# Patient Record
Sex: Male | Born: 1980 | Race: White | Hispanic: No | Marital: Married | Smoking: Never smoker
Health system: Southern US, Community
[De-identification: ages and names within clinical notes are randomized; demographics above are authoritative.]

## PROBLEM LIST (undated history)

## (undated) DIAGNOSIS — J9601 Acute respiratory failure with hypoxia: Secondary | ICD-10-CM

## (undated) DIAGNOSIS — U071 Acute respiratory failure with hypoxia: Secondary | ICD-10-CM

## (undated) DIAGNOSIS — F419 Anxiety disorder, unspecified: Secondary | ICD-10-CM

## (undated) DIAGNOSIS — S1191XA Laceration without foreign body of unspecified part of neck, initial encounter: Secondary | ICD-10-CM

## (undated) DIAGNOSIS — F333 Major depressive disorder, recurrent, severe with psychotic symptoms: Secondary | ICD-10-CM
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
## 2020-04-24 ENCOUNTER — Emergency Department (HOSPITAL_COMMUNITY)
Admission: EM | Admit: 2020-04-24 | Discharge: 2020-04-25 | Disposition: A | Discharge status: Home / Self Care | Payer: BC Managed Care – PPO | Source: Home / Self Care | Attending: Emergency Medicine | Admitting: Emergency Medicine

## 2020-04-24 ENCOUNTER — Other Ambulatory Visit: Payer: Self-pay

## 2020-04-24 ENCOUNTER — Encounter (HOSPITAL_COMMUNITY): Payer: Self-pay | Admitting: Emergency Medicine

## 2020-04-24 DIAGNOSIS — F323 Major depressive disorder, single episode, severe with psychotic features: Secondary | ICD-10-CM

## 2020-04-24 DIAGNOSIS — Z20822 Contact with and (suspected) exposure to covid-19: Secondary | ICD-10-CM | POA: Insufficient documentation
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
## 2020-04-25 ENCOUNTER — Inpatient Hospital Stay (HOSPITAL_COMMUNITY)
Admission: RE | Admit: 2020-04-25 | Discharge: 2020-04-27 | DRG: 885 | Disposition: A | Discharge status: Home / Self Care | Payer: BC Managed Care – PPO | Source: Intra-hospital | Attending: Psychiatry | Admitting: Psychiatry

## 2020-04-25 ENCOUNTER — Encounter (HOSPITAL_COMMUNITY): Payer: Self-pay | Admitting: Psychiatry

## 2020-04-25 DIAGNOSIS — Z818 Family history of other mental and behavioral disorders: Secondary | ICD-10-CM | POA: Diagnosis not present

## 2020-04-25 DIAGNOSIS — Z630 Problems in relationship with spouse or partner: Secondary | ICD-10-CM | POA: Diagnosis not present

## 2020-04-25 DIAGNOSIS — I10 Essential (primary) hypertension: Secondary | ICD-10-CM | POA: Diagnosis present

## 2020-04-25 DIAGNOSIS — F411 Generalized anxiety disorder: Secondary | ICD-10-CM | POA: Diagnosis present

## 2020-04-25 DIAGNOSIS — G4700 Insomnia, unspecified: Secondary | ICD-10-CM | POA: Diagnosis present

## 2020-04-25 DIAGNOSIS — R45851 Suicidal ideations: Secondary | ICD-10-CM | POA: Diagnosis present

## 2020-04-25 DIAGNOSIS — R000 Tachycardia, unspecified: Secondary | ICD-10-CM | POA: Diagnosis present

## 2020-04-25 DIAGNOSIS — F323 Major depressive disorder, single episode, severe with psychotic features: Principal | ICD-10-CM | POA: Diagnosis present

## 2020-04-25 DIAGNOSIS — E1165 Type 2 diabetes mellitus with hyperglycemia: Secondary | ICD-10-CM | POA: Diagnosis present

## 2020-04-25 DIAGNOSIS — Z20822 Contact with and (suspected) exposure to covid-19: Secondary | ICD-10-CM | POA: Diagnosis present

## 2020-04-25 LAB — RAPID URINE DRUG SCREEN, HOSP PERFORMED
Amphetamines: NOT DETECTED
Barbiturates: NOT DETECTED
Benzodiazepines: NOT DETECTED
Cocaine: NOT DETECTED
Opiates: NOT DETECTED
Tetrahydrocannabinol: NOT DETECTED

## 2020-04-25 LAB — CBC WITH DIFFERENTIAL/PLATELET
Abs Immature Granulocytes: 0.02 10*3/uL (ref 0.00–0.07)
Basophils Absolute: 0.1 10*3/uL (ref 0.0–0.1)
Basophils Relative: 1 %
Eosinophils Absolute: 0.2 10*3/uL (ref 0.0–0.5)
Eosinophils Relative: 2 %
HCT: 48.1 % (ref 39.0–52.0)
Hemoglobin: 16.4 g/dL (ref 13.0–17.0)
Immature Granulocytes: 0 %
Lymphocytes Relative: 26 %
Lymphs Abs: 2.3 10*3/uL (ref 0.7–4.0)
MCH: 30.6 pg (ref 26.0–34.0)
MCHC: 34.1 g/dL (ref 30.0–36.0)
MCV: 89.7 fL (ref 80.0–100.0)
Monocytes Absolute: 0.9 10*3/uL (ref 0.1–1.0)
Monocytes Relative: 10 %
Neutro Abs: 5.3 10*3/uL (ref 1.7–7.7)
Neutrophils Relative %: 61 %
Platelets: 308 10*3/uL (ref 150–400)
RBC: 5.36 MIL/uL (ref 4.22–5.81)
RDW: 12.4 % (ref 11.5–15.5)
WBC: 8.8 10*3/uL (ref 4.0–10.5)
nRBC: 0 % (ref 0.0–0.2)

## 2020-04-25 LAB — RESPIRATORY PANEL BY RT PCR (FLU A&B, COVID)
Influenza A by PCR: NEGATIVE
Influenza B by PCR: NEGATIVE
SARS Coronavirus 2 by RT PCR: NEGATIVE

## 2020-04-25 LAB — COMPREHENSIVE METABOLIC PANEL
ALT: 49 U/L — ABNORMAL HIGH (ref 0–44)
AST: 24 U/L (ref 15–41)
Albumin: 4.5 g/dL (ref 3.5–5.0)
Alkaline Phosphatase: 49 U/L (ref 38–126)
Anion gap: 8 (ref 5–15)
BUN: 11 mg/dL (ref 6–20)
CO2: 25 mmol/L (ref 22–32)
Calcium: 9.4 mg/dL (ref 8.9–10.3)
Chloride: 104 mmol/L (ref 98–111)
Creatinine, Ser: 0.76 mg/dL (ref 0.61–1.24)
GFR calc Af Amer: >60 mL/min (ref >60)
GFR calc non Af Amer: >60 mL/min (ref >60)
Glucose, Bld: 226 mg/dL — ABNORMAL HIGH (ref 70–99)
Potassium: 3.9 mmol/L (ref 3.5–5.1)
Sodium: 137 mmol/L (ref 135–145)
Total Bilirubin: 0.8 mg/dL (ref 0.3–1.2)
Total Protein: 8.5 g/dL — ABNORMAL HIGH (ref 6.5–8.1)

## 2020-04-25 LAB — TSH: TSH: 4.321 u[IU]/mL (ref 0.350–4.500)

## 2020-04-25 LAB — GLUCOSE, CAPILLARY: Glucose-Capillary: 208 mg/dL — ABNORMAL HIGH (ref 70–99)

## 2020-04-25 LAB — ETHANOL: Alcohol, Ethyl (B): <10 mg/dL (ref <10)

## 2020-04-25 MED ORDER — TRAZODONE HCL 50 MG PO TABS
50.0000 mg | ORAL_TABLET | Freq: Every evening | ORAL | Status: DC | PRN
  Administered 2020-04-25 – 2020-04-26 (×2): 50 mg via ORAL
  Filled 2020-04-25 (×2): qty 1

## 2020-04-25 MED ORDER — HYDROXYZINE HCL 25 MG PO TABS
25.0000 mg | ORAL_TABLET | Freq: Three times a day (TID) | ORAL | Status: DC | PRN
  Administered 2020-04-26: 25 mg via ORAL
  Filled 2020-04-25: qty 1

## 2020-04-25 MED ORDER — FLUOXETINE HCL 10 MG PO CAPS
10.0000 mg | ORAL_CAPSULE | Freq: Every day | ORAL | Status: DC
  Administered 2020-04-25 – 2020-04-26 (×2): 10 mg via ORAL
  Filled 2020-04-25 (×4): qty 1

## 2020-04-25 MED ORDER — ALUM & MAG HYDROXIDE-SIMETH 200-200-20 MG/5ML PO SUSP: 30.0000 mL | ORAL | Status: DC | PRN

## 2020-04-25 MED ORDER — ARIPIPRAZOLE 2 MG PO TABS
2.0000 mg | ORAL_TABLET | Freq: Every day | ORAL | Status: DC
  Administered 2020-04-25 – 2020-04-26 (×2): 2 mg via ORAL
  Filled 2020-04-25 (×4): qty 1

## 2020-04-25 MED ORDER — ACETAMINOPHEN 325 MG PO TABS: 650.0000 mg | ORAL_TABLET | Freq: Four times a day (QID) | ORAL | Status: DC | PRN

## 2020-04-25 MED ORDER — MAGNESIUM HYDROXIDE 400 MG/5ML PO SUSP: 30.0000 mL | Freq: Every day | ORAL | Status: DC | PRN

## 2020-04-25 NOTE — Progress Notes (Signed)
Patient ID: George Hobbs, male   DOB: [birth year], 39 y.o.   MRN: [id number] Pt stated he was diabetic. Pt stated he control with the ketogenic diet

## 2020-04-25 NOTE — [facility] Suicide Risk Assessment (Signed)
[facility] INPATIENT:  Family/Significant Other Suicide Prevention Education  Suicide Prevention Education:  Education Completed; George Hobbs, George Hobbs (Spouse)  [phone] (Mobile),  (name of family member/significant other) has been identified by the patient as the family member/significant other with whom the patient will be residing, and identified as the person(s) who will aid the patient in the event of a mental health crisis (suicidal ideations/suicide attempt).  With written consent from the patient, the family member/significant other has been provided the following suicide prevention education, prior to the and/or following the discharge of the patient.  The suicide prevention education provided includes the following:  Suicide risk factors  Suicide prevention and interventions  National Suicide Hotline telephone number  [facility] assessment telephone number  [facility] Emergency Assistance 911  [facility] and/or Residential Mobile Crisis Unit telephone number  Request made of family/significant other to:  Remove weapons (e.g., guns, rifles, knives), all items previously/currently identified as safety concern.    Remove drugs/medications (over-the-counter, prescriptions, illicit drugs), all items previously/currently identified as a safety concern.  The family member/significant other verbalizes understanding of the suicide prevention education information provided.  The family member/significant other agrees to remove the items of safety concern listed above.  CSW provided update to wife. Wife states that she does not want him to come home to her and that the plan is for him to live with his mother.   George Hobbs 04/25/2020, 3:08 PM

## 2020-04-25 NOTE — Progress Notes (Signed)
Pt denied SI/HI but did endorse auditory hallucinations.  Pt said the voices he hears tell pt that he is no good and reminds him of "all the bad things" pt has done.  RN established rapport with pt and assessed for needs/concerns.  RN administered medications per MD orders.  Pt tolerated medications well and no adverse reactions were noted.  Pt slept on and off during the day, spoke on the phone to family members and met with [facility] staff.  RN will continue to monitor and provide support as needed.
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
## 2020-04-25 NOTE — [facility] Group Notes (Signed)
[facility] LCSW Group Therapy  04/25/2020 2:15 PM  Type of Therapy:  Trust and Communication   Participation Level:  Did Not Attend  Participation Quality:  Did not attend  Affect:  Did not attend   Cognitive:  Did not attend   Insight:  Did not attend   Engagement in Therapy:  Did not attend   Modes of Intervention:  Did not attend   Summary of Progress/Problems: Mica did not attend group  Aram Beecham 04/25/2020, 2:15 PM

## 2020-04-25 NOTE — Tx Team (Signed)
Initial Treatment Plan 04/25/2020 5:23 AM Phoebe Perch JTT:[id number]    PATIENT STRESSORS: Marital or family conflict Medication change or noncompliance Other: hallucination   PATIENT STRENGTHS: Ability for insight Capable of independent living Motivation for treatment/growth Supportive family/friends   PATIENT IDENTIFIED PROBLEMS:   depression  anxiety  Auditory hallucination  "get on medication"  "fixing my marriage           DISCHARGE CRITERIA:  Improved stabilization in mood, thinking, and/or behavior Medical problems require only outpatient monitoring Motivation to continue treatment in a less acute level of care Need for constant or close observation no longer present  PRELIMINARY DISCHARGE PLAN: Attend aftercare/continuing care group Outpatient therapy Participate in family therapy Return to previous living arrangement Return to previous work or school arrangements  PATIENT/FAMILY INVOLVEMENT: This treatment plan has been presented to and reviewed with the patient, George Hobbs.  The patient and family have been given the opportunity to ask questions and make suggestions.  JEHU-APPIAH, Salley Scarlet, RN 04/25/2020, 5:23 AM
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
## 2020-04-25 NOTE — BHH Counselor (Signed)
Adult Comprehensive Assessment  Patient ID: George Hobbs, male   DOB: [birth year], 39 y.o.   MRN: [id number]  Information Source:  Pt   Current Stressors:  Patient states their primary concerns and needs for treatment are:: "hearing the voice" states voice started yesterday Patient states their goals for this hospitilization and ongoing recovery are:: "to make the voice stop so I can heal and maybe fix my marriage" Educational / Learning stressors: no current stressors Employment / Job issues: denies current stressors. Works in Consulting civil engineer Family Relationships: Lives with wife and 5 kids. Mom and brother in [place] area. Wife's family in [place] area. Sister about 30 mins away in [place] Financial / Lack of resources (include bankruptcy): Reports some budgeting challenges due to his wife not adhering to [employer] / Lack of housing: Lives with wife and 5 daughters Physical health (include injuries & life threatening diseases): Diabetes. States he doesn't have any problems regarding diabetes and manages it by diet. Social relationships: Reports having good social network. Reports 2 friends that are like "brothers. One in area and one in [place] Substance abuse: reports occasional alcohol use. No other use Bereavement / Loss: Father died about 7 years ago. Pt reports his newborn daughter (would have been 4th child) died at 29-79 days old in 11-28-09  Living/Environment/Situation:  Living Arrangements: Spouse/significant other, Children Living conditions (as described by patient or guardian): Good Who else lives in the home?: wife and 5 daughters How long has patient lived in current situation?: 2-3 years What is atmosphere in current home: Chaotic  Family History:  Marital status: Married Number of Years Married: 21 What types of issues is patient dealing with in the relationship?: pt reports that his wife told him day of admission that she did not want to be married any longer. Pt states that they  had been working on their marriage for a while. Pt states his wife treats his daughters like best friends versus a parent which causes strain in pt's relationships with children Are you sexually active?: Yes What is your sexual orientation?: heterosexual Does patient have children?: Yes How many children?: 5 How is patient's relationship with their children?: Reports some strain in relationship with kids  Childhood History:  By whom was/is the patient raised?: Both parents Description of patient's relationship with caregiver when they were a child: "good" Patient's description of current relationship with people who raised him/her: "good" father is deceased How were you disciplined when you got in trouble as a child/adolescent?: spanked Does patient have siblings?: Yes Number of Siblings: 2 Description of patient's current relationship with siblings: Really good with brother. 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[employer] long has patient been employed?: 7 months Patient's job has been impacted by current illness: Yes Describe how patient's job has been impacted: Missing work while in [employer] [facility] What is the longest time patient has a held a job?: 10 years Where was the patient employed at that time?: [employer] Has  patient ever been in the [employer]?: No  Financial Resources:   Financial  resources: Income from employment Does patient have a representative payee or guardian?: No  Alcohol/Substance Abuse:   What has been your use of drugs/alcohol within the last 12 months?: occasional alcohol use. If attempted suicide, did drugs/alcohol play a role in this?: No Alcohol/Substance Abuse Treatment Hx: Denies past history Has alcohol/substance abuse ever caused legal problems?: No  Social Support System:   Patient's Community Support System: Good Describe Community Support System: wife, mom, brother, 2 best friends Type of faith/religion: Ephriam Knuckles How does patient's faith help to cope with current illness?: reports it hasn't been helpful because he has not been as dedicated to his faith recently  Leisure/Recreation:   Do You Have Hobbies?: Yes Leisure and Hobbies: writing, music,video games  Strengths/Needs:   What is the patient's perception of their strengths?: listening, communicating, compassionate Patient states these barriers may affect/interfere with their treatment: Pt reports main barrier is the voice he hears. He states that the voice tells him that it will be there until his wife takes him back> Patient states these barriers may affect their return to the community: Pt states "I want her to want me to be home" Pt states if he cannot return home he can live with his mom  Discharge Plan:   Currently receiving community mental health services: No Patient states concerns and preferences for aftercare planning are: okay with therapy and med management. Wants somewhere that would also offer family at some point Patient states they will know when they are safe and ready for discharge when: Explains he doesn't know. he explains he does not expect his voice to go completely away by the time he discharges. Does patient have access to transportation?: Yes Does patient have financial barriers related to discharge medications?: No Plan for living situation after discharge: Pt  will either return to live with his wife and go to live with his mom Will patient be returning to same living situation after discharge?: No  Summary/Recommendations:  : Patient is a 39 year old male who presented to the [facility] emergency department on 04/24/2020 with new onset auditory hallucinations.  The patient also reportedly had a knife earlier and the knife was able to be taken away from him.  Wife stated current situation of separation may have been a stressor for these events.  In the emergency department he denied suicidal or homicidal ideation.  Pt stated that he and his wife were having difficulty.  Initially this started over a year ago.  His wife found out that he was having an Internet relationship.  He stopped that.  Unfortunately they had other conflicts in their marriage, and he had established another online relationship.  He stated that she for gave him for that, but wanted to leave the marriage.  Pt AH began during argument about wife leaving marriage.Pt was admitted to [facility].   Pt lives at home with wife and 5 children, the oldest of which is 76. 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 Pt mainly reports family situation as a stressor. He has difficulty accepting that his wife wants to leave him. He also reports that his wife involves his kids in their problems which has resulted in strain between him and his kids. Pt states that his auditory hallucinations are new. He explains to therapist that when he had the knife during incident leading up to admission, pt was  only trying to get the attention of his voice rather than harm others. Pt is very focused on need to save his marriage. Pt is open to individual therapy and medication management but also wants a referral to a place that also provides couples therapy. Pt states he only wants to return home with his wife if she wants him home. He will stay with his mother if she does not. Pt  reports having good supports including his mom, brother, sister, and 2 good friends. Patient will benefit from crisis stabilization, medication evaluation, group therapy and psychoeducation, in addition to case management for discharge planning.  Carlea Badour P Pierce Barocio. 04/25/2020

## 2020-04-25 NOTE — B[facility] Group Notes (Signed)
Pt was invited to attend group on [address] hall, due to them being the only pt up on their hall. Pt refused, due to it being their first day.

## 2020-04-25 NOTE — [facility] Suicide Risk Assessment (Signed)
[facility] INPATIENT:  Family/Significant Other Suicide Prevention Education  Suicide Prevention Education:  Education Completed; Rayhan Groleau  (name of family member/significant other) has been identified by the patient as the family member/significant other with whom the patient will be residing, and identified as the person(s) who will aid the patient in the event of a mental health crisis (suicidal ideations/suicide attempt).  With written consent from the patient, the family member/significant other has been provided the following suicide prevention education, prior to the and/or following the discharge of the patient.  The suicide prevention education provided includes the following:  Suicide risk factors  Suicide prevention and interventions  National Suicide Hotline telephone number  [facility] assessment telephone number  [facility] Emergency Assistance 911  [facility] and/or Residential Mobile Crisis Unit telephone number  Request made of family/significant other to:  Remove weapons (e.g., guns, rifles, knives), all items previously/currently identified as safety concern.    Remove drugs/medications (over-the-counter, prescriptions, illicit drugs), all items previously/currently identified as a safety concern.  The family member/significant other verbalizes understanding of the suicide prevention education information provided.  The family member/significant other agrees to remove the items of safety concern listed above.  CSW provides update to mother. Mother reports that pt's father has had a history of Bipolar disorder. Mom is supportive of OP referrals. Mom states that she is okay with pt discharging to live with her. Mom reports that she has no weapons in the home. Mom plans to secure her medications to make them harder to access.  [name] 04/25/2020, 3:09 PM

## 2020-04-25 NOTE — Tx Team (Signed)
Interdisciplinary Treatment and Diagnostic Plan Update  04/25/2020 Time of Session: 9:45am George Hobbs MRN: [id number]  Principal Diagnosis: <principal problem not specified>  Secondary Diagnoses: Active Problems:   Major depression with psychotic features (HCC)   Current Medications:  Current Facility-Administered Medications  Medication Dose Route Frequency Provider Last Rate Last Admin  . acetaminophen (TYLENOL) tablet 650 mg  650 mg Oral Q6H PRN Antonieta Pert, MD      . alum & mag hydroxide-simeth (MAALOX/MYLANTA) 200-200-20 MG/5ML suspension 30 mL  30 mL Oral Q4H PRN Antonieta Pert, MD      . ARIPiprazole (ABILIFY) tablet 2 mg  2 mg Oral Daily Antonieta Pert, MD   2 mg at 04/25/20 1017  . FLUoxetine (PROZAC) capsule 10 mg  10 mg Oral Daily Antonieta Pert, MD   10 mg at 04/25/20 1017  . hydrOXYzine (ATARAX/VISTARIL) tablet 25 mg  25 mg Oral TID PRN Antonieta Pert, MD      . magnesium hydroxide (MILK OF MAGNESIA) suspension 30 mL  30 mL Oral Daily PRN Antonieta Pert, MD      . traZODone (DESYREL) tablet 50 mg  50 mg Oral QHS PRN Antonieta Pert, MD       PTA Medications: No medications prior to admission.    Patient Stressors: Marital or family conflict Medication change or noncompliance Other: hallucination  Patient Strengths: Ability for insight Capable of independent living Motivation for treatment/growth Supportive family/friends  Treatment Modalities: Medication Management, Group therapy, Case management,  1 to 1 session with clinician, Psychoeducation, Recreational therapy.   Physician Treatment Plan for Primary Diagnosis: <principal problem not specified> Long Term Goal(s):     Short Term Goals:    Medication Management: Evaluate patient's response, side effects, and tolerance of medication regimen.  Therapeutic Interventions: 1 to 1 sessions, Unit Group sessions and Medication administration.  Evaluation of Outcomes:  Progressing  Physician Treatment Plan for Secondary Diagnosis: Active Problems:   Major depression with psychotic features (HCC)  Long Term Goal(s):     Short Term Goals:       Medication Management: Evaluate patient's response, side effects, and tolerance of medication regimen.  Therapeutic Interventions: 1 to 1 sessions, Unit Group sessions and Medication administration.  Evaluation of Outcomes: Progressing   RN Treatment Plan for Primary Diagnosis: <principal problem not specified> Long Term Goal(s): Knowledge of disease and therapeutic regimen to maintain health will improve  Short Term Goals: Ability to remain free from injury will improve, Ability to demonstrate self-control, Ability to participate in decision making will improve, Ability to verbalize feelings will improve, Ability to disclose and discuss suicidal ideas and Ability to identify and develop effective coping behaviors will improve  Medication Management: RN will administer medications as ordered by provider, will assess and evaluate patient's response and provide education to patient for prescribed medication. RN will report any adverse and/or side effects to prescribing provider.  Therapeutic Interventions: 1 on 1 counseling sessions, Psychoeducation, Medication administration, Evaluate responses to treatment, Monitor vital signs and CBGs as ordered, Perform/monitor CIWA, COWS, AIMS and Fall Risk screenings as ordered, Perform wound care treatments as ordered.  Evaluation of Outcomes: Progressing   LCSW Treatment Plan for Primary Diagnosis: <principal problem not specified> Long Term Goal(s): Safe transition to appropriate next level of care at discharge, Engage patient in therapeutic group addressing interpersonal concerns.  Short Term Goals: Engage patient in aftercare planning with referrals and resources, Increase social support, Increase emotional regulation, Facilitate acceptance of mental  health diagnosis  and concerns, Identify triggers associated with mental health/substance abuse issues and Increase skills for wellness and recovery  Therapeutic Interventions: Assess for all discharge needs, 1 to 1 time with Social worker, Explore available resources and support systems, Assess for adequacy in community support network, Educate family and significant other(s) on suicide prevention, Complete Psychosocial Assessment, Interpersonal group therapy.  Evaluation of Outcomes: Progressing   Progress in Treatment: Attending groups: No. Participating in groups: No. Taking medication as prescribed: Yes. Toleration medication: Yes. Family/Significant other contact made: No, will contact:  If consents are given  Patient understands diagnosis: Yes. and No. Discussing patient identified problems/goals with staff: Yes. Medical problems stabilized or resolved: Yes. Denies suicidal/homicidal ideation: Yes. Issues/concerns per patient self-inventory: No.   New problem(s) identified: No, Describe:  None  New Short Term/Long Term Goal(s): medication stabilization, elimination of SI thoughts, development of comprehensive mental wellness plan.   Patient Goals:  "To get the voices to stop"   Discharge Plan or Barriers: Patient recently admitted. CSW will continue to follow and assess for appropriate referrals and possible discharge planning.   Reason for Continuation of Hospitalization: Depression Medication stabilization Suicidal ideation  Estimated Length of Stay: 3 to 5 days   Attendees: Patient: George Hobbs 04/25/2020   Physician:  04/25/2020   Nursing: Marciano Sequin, NP 04/25/2020   RN Care Manager: 04/25/2020   Social Worker: Melba Coon, LCSW 04/25/2020   Recreational Therapist:  04/25/2020   Other:  04/25/2020   Other:  04/25/2020   Other: 04/25/2020      Scribe for Treatment Team: Aram Beecham, LCSWA 04/25/2020 11:11 AM

## 2020-04-26 LAB — GLUCOSE, CAPILLARY: Glucose-Capillary: 188 mg/dL — ABNORMAL HIGH (ref 70–99)

## 2020-04-26 MED ORDER — ARIPIPRAZOLE 2 MG PO TABS
2.0000 mg | ORAL_TABLET | Freq: Every day | ORAL | Status: DC
  Administered 2020-04-26: 2 mg via ORAL
  Filled 2020-04-26 (×3): qty 1

## 2020-04-26 MED ORDER — METFORMIN HCL 500 MG PO TABS
500.0000 mg | ORAL_TABLET | Freq: Every day | ORAL | Status: DC
  Administered 2020-04-26 – 2020-04-27 (×2): 500 mg via ORAL
  Filled 2020-04-26 (×3): qty 1

## 2020-04-26 MED ORDER — FLUOXETINE HCL 10 MG PO CAPS
10.0000 mg | ORAL_CAPSULE | Freq: Once | ORAL | Status: AC
  Administered 2020-04-26: 10 mg via ORAL
  Filled 2020-04-26: qty 1

## 2020-04-26 MED ORDER — FLUOXETINE HCL 20 MG PO CAPS
20.0000 mg | ORAL_CAPSULE | Freq: Every day | ORAL | Status: DC
  Administered 2020-04-27: 20 mg via ORAL
  Filled 2020-04-26 (×2): qty 1

## 2020-04-26 NOTE — [facility] Group Notes (Signed)
Pt was invited to group on [address] hall. Pt opted out of going to group.

## 2020-04-26 NOTE — Progress Notes (Signed)
Recreation Therapy Notes  Animal-Assisted Activity (AAA) Program Checklist/Progress Notes Patient Eligibility Criteria Checklist & Daily Group note for Rec Tx Intervention  Date: 10.5.21 Time: 1430 Location: [address]   AAA/T Program Assumption of Risk Form signed by Engineer, production or Parent Legal Guardian  YES  Patient is free of allergies or sever asthma  YES  Patient reports no fear of animals  YES   Patient reports no history of cruelty to animals YES  Patient understands his/her participation is voluntary  YES  Patient washes hands before animal contact  YES   Patient washes hands after animal contact  YES  Education: Charity fundraiser, Appropriate Animal Interaction   Education Outcome: Acknowledges understanding/In group clarification offered/Needs additional education.   Clinical Observations/Feedback: Pt did not attend activity.    Caroll Rancher, LRT/CTRS         Caroll Rancher A 04/26/2020 3:37 PM

## 2020-04-26 NOTE — Progress Notes (Signed)
   04/26/20 2022  Psych Admission Type (Psych Patients Only)  Admission Status Voluntary  Psychosocial Assessment  Patient Complaints None  Eye Contact Fair  Facial Expression Flat  Affect Depressed  Speech Logical/coherent  Interaction Assertive  Motor Activity Other (Comment) (wnl)  Appearance/Hygiene Unremarkable  Behavior Characteristics Cooperative  Mood Depressed;Pleasant  Thought Process  Coherency Circumstantial  Content Blaming self  Delusions None reported or observed  Perception WDL  Hallucination None reported or observed  Judgment Poor  Confusion None  Danger to Self  Current suicidal ideation? Denies  Danger to Others  Danger to Others None reported or observed   Pt seen on the phone most of the early hours of the shift. Pt denies SI, HI, AVH and pain. Was told by day shift RN that pt wasn't eating. Pt states that he has not eaten since Sunday. "I am diabetic and I am on the keto diet. I fast so that I will go into ketosis. That's how I manage my diabetes." Pt explained the dangers of ketosis given that he is diabetic. Pt nodded and stated that he doesn't have an appetite and that he is drinking water. Pt told that supplementation may be necessary because water doesn't contain any nutrients.

## 2020-04-26 NOTE — Progress Notes (Signed)
[facility] MD Progress Note  04/26/2020 9:52 AM George Hobbs  MRN:  [id number] Subjective: Patient is a 39 year old male who presented to the [facility] emergency department on 04/24/2020 with new onset auditory hallucinations and suicidal ideation.  Objective: Patient is seen and examined.  Patient is a 39 year old male with the above-stated past psychiatric history seen in follow-up.  He stated he feels a little bit better today but sedated.  He stated the medications made him feel very sleepy.  He stated he spoke to his wife yesterday, and she is basically told him that the relationship is over.  He feels like she is lied to him.  He stated that she had encouraged him to get help to "heal", but that now he feels like she knew that the relationship was over and was falsely encouraging him.  He stated that the auditory hallucinations have resolved.  He denied any suicidal or homicidal ideation.  He stated the only issue was that he felt very sedated yesterday.  We discussed the possibility of moving the Abilify to bedtime.  His blood pressure is 144/90 and 128/95.  Heart rate was 87 and went up to 103.  He is afebrile.  Sleep was not recorded.  His blood sugar this morning is 188.  TSH was 4.321.  Apparently hemoglobin A1c was not done.  Principal Problem: <principal problem not specified> Diagnosis: Active Problems:   Major depression with psychotic features (HCC)  Total Time spent with patient: 20 minutes  Past Psychiatric History: See admission H&P  Past Medical History: History reviewed. No pertinent past medical history. History reviewed. No pertinent surgical history. Family History: History reviewed. No pertinent family history. Family Psychiatric  History: See admission H&P Social History:  Social History   Substance and Sexual Activity  Alcohol Use Not Currently     Social History   Substance and Sexual Activity  Drug Use Never    Social History   Socioeconomic History  . Marital  status: Married    Spouse name: Not on file  . Number of children: 5  . Years of education: Not on file  . Highest education level: Not on file  Occupational History  . Not on file  Tobacco Use  . Smoking status: Never Smoker  . Smokeless tobacco: Never Used  Substance and Sexual Activity  . Alcohol use: Not Currently  . Drug use: Never  . Sexual activity: Yes  Other Topics Concern  . Not on file  Social History Narrative  . Not on file   Social Determinants of Health   Financial Resource Strain:   . Difficulty of Paying Living Expenses: Not on file  Food Insecurity:   . Worried About Programme researcher, broadcasting/film/video in the Last Year: Not on file  . Ran Out of Food in the Last Year: Not on file  Transportation Needs:   . Lack of Transportation (Medical): Not on file  . Lack of Transportation (Non-Medical): Not on file  Physical Activity:   . Days of Exercise per Week: Not on file  . Minutes of Exercise per Session: Not on file  Stress:   . Feeling of Stress : Not on file  Social Connections:   . Frequency of Communication with Friends and Family: Not on file  . Frequency of Social Gatherings with Friends and Family: Not on file  . Attends Religious Services: Not on file  . Active Member of Clubs or Organizations: Not on file  . Attends Banker Meetings:  Not on file  . Marital Status: Not on file   Additional Social History:                         Sleep: Good  Appetite:  Fair  Current Medications: Current Facility-Administered Medications  Medication Dose Route Frequency Provider Last Rate Last Admin  . acetaminophen (TYLENOL) tablet 650 mg  650 mg Oral Q6H PRN Antonieta Pert, MD      . alum & mag hydroxide-simeth (MAALOX/MYLANTA) 200-200-20 MG/5ML suspension 30 mL  30 mL Oral Q4H PRN Antonieta Pert, MD      . ARIPiprazole (ABILIFY) tablet 2 mg  2 mg Oral Daily Antonieta Pert, MD   2 mg at 04/26/20 0753  . FLUoxetine (PROZAC) capsule 10 mg   10 mg Oral Daily Antonieta Pert, MD   10 mg at 04/26/20 0753  . hydrOXYzine (ATARAX/VISTARIL) tablet 25 mg  25 mg Oral TID PRN Antonieta Pert, MD      . magnesium hydroxide (MILK OF MAGNESIA) suspension 30 mL  30 mL Oral Daily PRN Antonieta Pert, MD      . traZODone (DESYREL) tablet 50 mg  50 mg Oral QHS PRN Antonieta Pert, MD   50 mg at 04/25/20 2057    Lab Results:  Results for orders placed or performed during the hospital encounter of 04/25/20 (from the past 48 hour(s))  Glucose, capillary     Status: Abnormal   Collection Time: 04/25/20  7:31 AM  Result Value Ref Range   Glucose-Capillary 208 (H) 70 - 99 mg/dL    Comment: Glucose reference range applies only to samples taken after fasting for at least 8 hours.  TSH     Status: None   Collection Time: 04/25/20  6:16 PM  Result Value Ref Range   TSH 4.321 0.350 - 4.500 uIU/mL    Comment: Performed by a 3rd Generation assay with a functional sensitivity of <=0.01 uIU/mL. Performed at [facility], [address], [place], [place] [address]   Glucose, capillary     Status: Abnormal   Collection Time: 04/26/20  6:28 AM  Result Value Ref Range   Glucose-Capillary 188 (H) 70 - 99 mg/dL    Comment: Glucose reference range applies only to samples taken after fasting for at least 8 hours.   Comment 1 Notify RN    Comment 2 Document in Chart     Blood Alcohol level:  Lab Results  Component Value Date   ETH <10 04/25/2020    Metabolic Disorder Labs: No results found for: HGBA1C, MPG No results found for: PROLACTIN No results found for: CHOL, TRIG, HDL, CHOLHDL, VLDL, LDLCALC  Physical Findings: AIMS: Facial and Oral Movements Muscles of Facial Expression: None, normal Lips and Perioral Area: None, normal Jaw: None, normal Tongue: None, normal,Extremity Movements Upper (arms, wrists, hands, fingers): None, normal Lower (legs, knees, ankles, toes): None, normal, Trunk Movements Neck,  shoulders, hips: None, normal, Overall Severity Severity of abnormal movements (highest score from questions above): None, normal Incapacitation due to abnormal movements: None, normal Patient's awareness of abnormal movements (rate only patient's report): No Awareness, Dental Status Current problems with teeth and/or dentures?: No Does patient usually wear dentures?: No  CIWA:    COWS:     Musculoskeletal: Strength & Muscle Tone: within normal limits Gait & Station: normal Patient leans: N/A  Psychiatric Specialty Exam: Physical Exam Vitals and nursing note reviewed.  Constitutional:  Appearance: Normal appearance.  HENT:     Head: Normocephalic and atraumatic.  Pulmonary:     Effort: Pulmonary effort is normal.  Neurological:     General: No focal deficit present.     Mental Status: He is alert and oriented to person, place, and time.     Review of Systems  All other systems reviewed and are negative.   Blood pressure (!) 128/95, pulse (!) 103, temperature 98 F (36.7 C), temperature source Oral, resp. rate 16, height 5\' 10"  (1.778 m), weight 113.4 kg, SpO2 98 %.Body mass index is 35.87 kg/m.  General Appearance: Casual  Eye Contact:  Fair  Speech:  Normal Rate  Volume:  Decreased  Mood:  Depressed  Affect:  Congruent  Thought Process:  Coherent and Descriptions of Associations: Intact  Orientation:  Full (Time, Place, and Person)  Thought Content:  Logical  Suicidal Thoughts:  No  Homicidal Thoughts:  No  Memory:  Immediate;   Fair Recent;   Fair Remote;   Fair  Judgement:  Intact  Insight:  Fair  Psychomotor Activity:  Psychomotor Retardation  Concentration:  Concentration: Fair and Attention Span: Fair  Recall:  of Knowledge:  Fair  Language:  Fair  Akathisia:  Negative  Handed:  Right  AIMS (if indicated):     Assets:  Desire for Improvement Housing Resilience Social Support  ADL's:  Intact  Cognition:  WNL  Sleep:         Treatment Plan Summary: Daily contact with patient to assess and evaluate symptoms and progress in treatment, Medication management and Plan : Patient is seen and examined.  Patient is a 39 year old male with the above-stated past psychiatric history who is seen in follow-up.   Diagnosis: 1.  Major depression, first episode, severe with psychotic features 2.  Type 2 diabetes mellitus 3.  Tachycardia 4.  Hypertension  Pertinent findings on examination today: 1.  Some sedation from Abilify. 2.  Denied suicidal or homicidal ideation. 3.  Affect and mood are still depressed 4.  Blood sugar elevated at 188 5.  Tachycardia  Plan: 1.  Change Abilify 2 mg 2 p.o. nightly for mood stability and depression augmentation. 2.  Increase fluoxetine to 20 mg p.o. daily for depression and anxiety. 3.  Add Metformin 500 mg p.o. daily for type 2 diabetes mellitus. 3.  Get EKG secondary to tachycardia. 4.  Monitor blood pressure secondary to elevation today. 5.  Disposition planning-in progress.  40, MD 04/26/2020, 9:52 AM

## 2020-04-26 NOTE — Plan of Care (Signed)
  Problem: Safety: Goal: Periods of time without injury will increase Outcome: Progressing   

## 2020-04-26 NOTE — Progress Notes (Signed)
   04/26/20 0800  Psych Admission Type (Psych Patients Only)  Admission Status Voluntary  Psychosocial Assessment  Patient Complaints Sadness  Eye Contact Fair  Facial Expression Flat  Affect Depressed  Speech Logical/coherent  Interaction Assertive  Motor Activity Other (Comment) (WDL)  Appearance/Hygiene Unremarkable  Behavior Characteristics Cooperative  Mood Depressed;Anxious  Thought Process  Coherency Circumstantial  Content Blaming self  Delusions Referential  Perception WDL  Hallucination None reported or observed  Judgment Poor  Confusion None  Danger to Self  Current suicidal ideation? Denies  Danger to Others  Danger to Others None reported or observed   Pt has been in his room this morning reading a book. Pt talked about his relationship issues with his wife and how she talks to their oldest daughter as an in between person to him. He said he thought the relationship was improving and then she told him she wanted to end the relationship. He said she has a mental health hx along with some of his children. Pt said he understands why his family wanted him to come to the hospital. He got upset and said he was trying to talk things out to himself and referred to himself as a man. He took a knife and made cut marks on a dresser. Pt says he never intended to use the knife on anyone. Offered Support, encouragement and 15 minute checks. Pt denies si and hi. He denies hallucinations and agrees to alert staff for any issues or concerns. Safety maintained on the unit.

## 2020-04-26 NOTE — Progress Notes (Signed)
Patient presents with sad, flat affect. Denies any SI, HI, AVH. Reports just want to go home. States his stressor is the breakup of his marriage. Reports he is confident that wife will not give him another chance. Reports he ready for discharge so that he can follow up with his therapy and get on with his life. Patient given trazodone for sleep with good relief. Encouragement and support provided, safety checks maintained, Pt receptive and remains safe on unit with q 15 min checks.

## 2020-04-27 LAB — GLUCOSE, CAPILLARY: Glucose-Capillary: 131 mg/dL — ABNORMAL HIGH (ref 70–99)

## 2020-04-27 MED ORDER — FLUOXETINE HCL 20 MG PO CAPS
20.0000 mg | ORAL_CAPSULE | Freq: Every day | ORAL | 0 refills | Status: AC
Start: 2020-04-28 — End: ?

## 2020-04-27 MED ORDER — TRAZODONE HCL 50 MG PO TABS
50.0000 mg | ORAL_TABLET | Freq: Every evening | ORAL | 0 refills | Status: AC | PRN
Start: 2020-04-27 — End: ?

## 2020-04-27 MED ORDER — HYDROXYZINE HCL 25 MG PO TABS
25.0000 mg | ORAL_TABLET | Freq: Three times a day (TID) | ORAL | 0 refills | Status: AC | PRN
Start: 2020-04-27 — End: ?

## 2020-04-27 MED ORDER — METFORMIN HCL 500 MG PO TABS
500.0000 mg | ORAL_TABLET | Freq: Every day | ORAL | 0 refills | Status: AC
Start: 2020-04-28 — End: ?

## 2020-04-27 MED ORDER — ARIPIPRAZOLE 2 MG PO TABS: 2.0000 mg | ORAL_TABLET | Freq: Every day | ORAL | 0 refills | Status: AC

## 2020-04-27 NOTE — Plan of Care (Signed)
Nursing discharge note: Patient discharged home per MD order.  Patient received all personal belongings from unit and locker.  Reviewed AVS/transition record with patient and he indicates understanding.  Patient will follow up with [facility].  Patient received printed prescriptions and was advised to pick up meds at any pharmacy. Patient denies any thoughts of self harm.  He left ambulatory with his wife.   Problem: Education: Goal: Utilization of techniques to improve thought processes will improve Outcome: Completed/Met Goal: Knowledge of the prescribed therapeutic regimen will improve Outcome: Completed/Met   Problem: Activity: Goal: Interest or engagement in leisure activities will improve Outcome: Completed/Met Goal: Imbalance in normal sleep/wake cycle will improve Outcome: Completed/Met   Problem: Coping: Goal: Coping ability will improve Outcome: Completed/Met Goal: Will verbalize feelings Outcome: Completed/Met   Problem: Health Behavior/Discharge Planning: Goal: Ability to make decisions will improve Outcome: Completed/Met Goal: Compliance with therapeutic regimen will improve Outcome: Completed/Met   Problem: Role Relationship: Goal: Will demonstrate positive changes in social behaviors and relationships Outcome: Completed/Met   Problem: Safety: Goal: Ability to disclose and discuss suicidal ideas will improve Outcome: Completed/Met Goal: Ability to identify and utilize support systems that promote safety will improve Outcome: Completed/Met   Problem: Self-Concept: Goal: Will verbalize positive feelings about self Outcome: Completed/Met Goal: Level of anxiety will decrease Outcome: Completed/Met   Problem: Education: Goal: Ability to state activities that reduce stress will improve Outcome: Completed/Met   Problem: Coping: Goal: Ability to identify and develop effective coping behavior will improve Outcome: Completed/Met   Problem: Self-Concept: Goal:  Ability to identify factors that promote anxiety will improve Outcome: Completed/Met Goal: Level of anxiety will decrease Outcome: Completed/Met Goal: Ability to modify response to factors that promote anxiety will improve Outcome: Completed/Met   Problem: Education: Goal: Knowledge of [place] [facility] Education information/materials will improve Outcome: Completed/Met Goal: Emotional status will improve Outcome: Completed/Met Goal: Mental status will improve Outcome: Completed/Met Goal: Verbalization of understanding the information provided will improve Outcome: Completed/Met   Problem: Activity: Goal: Interest or engagement in activities will improve Outcome: Completed/Met Goal: Sleeping patterns will improve Outcome: Completed/Met   Problem: Coping: Goal: Ability to verbalize frustrations and anger appropriately will improve Outcome: Completed/Met Goal: Ability to demonstrate self-control will improve Outcome: Completed/Met   Problem: Health Behavior/Discharge Planning: Goal: Identification of resources available to assist in meeting health care needs will improve Outcome: Completed/Met Goal: Compliance with treatment plan for underlying cause of condition will improve Outcome: Completed/Met   Problem: Physical Regulation: Goal: Ability to maintain clinical measurements within normal limits will improve Outcome: Completed/Met   Problem: Safety: Goal: Periods of time without injury will increase Outcome: Completed/Met   Problem: Activity: Goal: Will verbalize the importance of balancing activity with adequate rest periods Outcome: Completed/Met   Problem: Education: Goal: Will be free of psychotic symptoms Outcome: Completed/Met Goal: Knowledge of the prescribed therapeutic regimen will improve Outcome: Completed/Met   Problem: Coping: Goal: Coping ability will improve Outcome: Completed/Met Goal: Will verbalize feelings Outcome: Completed/Met    Problem: Health Behavior/Discharge Planning: Goal: Compliance with prescribed medication regimen will improve Outcome: Completed/Met   Problem: Nutritional: Goal: Ability to achieve adequate nutritional intake will improve Outcome: Completed/Met   Problem: Role Relationship: Goal: Ability to communicate needs accurately will improve Outcome: Completed/Met Goal: Ability to interact with others will improve Outcome: Completed/Met   Problem: Safety: Goal: Ability to redirect hostility and anger into socially appropriate behaviors will improve Outcome: Completed/Met Goal: Ability to remain free from injury will improve Outcome: Completed/Met  Problem: Self-Care: Goal: Ability to participate in self-care as condition permits will improve Outcome: Completed/Met   Problem: Self-Concept: Goal: Will verbalize positive feelings about self Outcome: Completed/Met

## 2020-04-27 NOTE — Progress Notes (Signed)
  [facility] Adult Case Management Discharge Plan :  Will you be returning to the same living situation after discharge:  No. Will be staying with mother. At discharge, do you have transportation home?: Yes,  wife is to pick this patient up Do you have the ability to pay for your medications: Yes,  has insurance   Release of information consent forms completed and in the chart;  Patient's signature needed at discharge.  Patient to Follow up at:  Follow-up Information    Services, [facility]. Go on 05/03/2020.   Why: You have a hospital discharge appointment on 05/03/20 at 10:00 am for therapy and medication management services. This appointment will be held in person.  Please bring photo ID, insurance card and proof of any income. Contact information: [address] [place] [address] [phone]               Next level of care provider has access to [facility] Link:no  Safety Planning and Suicide Prevention discussed: Yes,  with mother  Have you used any form of tobacco in the last 30 days? (Cigarettes, Smokeless Tobacco, Cigars, and/or Pipes): Patient Refused Screening  Has patient been referred to the Quitline?: N/A patient is not a smoker  Patient has been referred for addiction treatment: N/A  Otelia Santee, LCSW 04/27/2020, 9:40 AM
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
## 2020-04-27 NOTE — [facility] Suicide Risk Assessment (Signed)
[facility] Discharge Suicide Risk Assessment   Principal Problem: <principal problem not specified> Discharge Diagnoses: Active Problems:   Major depression with psychotic features (HCC)   Total Time spent with patient: 20 minutes  Musculoskeletal: Strength & Muscle Tone: within normal limits Gait & Station: normal Patient leans: N/A  Psychiatric Specialty Exam: Review of Systems  All other systems reviewed and are negative.   Blood pressure 134/90, pulse (!) 103, temperature 98 F (36.7 C), temperature source Oral, resp. rate 16, height 5\' 10"  (1.778 m), weight 113.4 kg, SpO2 98 %.Body mass index is 35.87 kg/m.  General Appearance: Casual  Eye Contact::  Fair  Speech:  Normal Rate409  Volume:  Normal  Mood:  Euthymic  Affect:  Congruent  Thought Process:  Coherent and Descriptions of Associations: Intact  Orientation:  Full (Time, Place, and Person)  Thought Content:  Logical  Suicidal Thoughts:  No  Homicidal Thoughts:  No  Memory:  Immediate;   Good Recent;   Good Remote;   Good  Judgement:  Intact  Insight:  Fair  Psychomotor Activity:  Normal  Concentration:  Good  Recall:  Good  Fund of Knowledge:Good  Language: Good  Akathisia:  Negative  Handed:  Right  AIMS (if indicated):     Assets:  Desire for Improvement Financial Resources/Insurance Housing Resilience Social Support Talents/Skills Transportation Vocational/Educational  Sleep:  Number of Hours: 6.75  Cognition: WNL  ADL's:  Intact   Mental Status Per Nursing Assessment::   On Admission:  NA  Demographic Factors:  Male and Caucasian  Loss Factors: Loss of significant relationship  Historical Factors: Impulsivity  Risk Reduction Factors:   Sense of responsibility to family, Employed, Living with another person, especially a relative and Positive social support  Continued Clinical Symptoms:  Depression:   Impulsivity  Cognitive Features That Contribute To Risk:  None    Suicide Risk:   Minimal: No identifiable suicidal ideation.  Patients presenting with no risk factors but with morbid ruminations; may be classified as minimal risk based on the severity of the depressive symptoms   Follow-up Information    [facility], [facility]. Go on 05/03/2020.   Why: You have a hospital discharge appointment on 05/03/20 at 10:00 am for therapy and medication management services. This appointment will be held in person.  Please bring photo ID, insurance card and proof of any income. Contact information: [address] [place] [phone]               Plan Of Care/Follow-up recommendations:  Activity:  ad lib  [phone], MD 04/27/2020, 9:20 AM

## 2020-05-26 ENCOUNTER — Emergency Department (HOSPITAL_COMMUNITY): Payer: BC Managed Care – PPO | Admitting: Anesthesiology

## 2020-05-26 ENCOUNTER — Emergency Department (HOSPITAL_COMMUNITY): Payer: BC Managed Care – PPO

## 2020-05-26 ENCOUNTER — Inpatient Hospital Stay (HOSPITAL_COMMUNITY): Payer: BC Managed Care – PPO

## 2020-05-26 ENCOUNTER — Encounter (HOSPITAL_COMMUNITY)
Admission: EM | Disposition: A | Discharge status: Other Acute Inpatient Hospital | Payer: Self-pay | Source: Home / Self Care

## 2020-05-26 ENCOUNTER — Inpatient Hospital Stay (HOSPITAL_COMMUNITY)
Admission: EM | Admit: 2020-05-26 | Discharge: 2020-06-07 | DRG: 981 | Disposition: A | Discharge status: Other Acute Inpatient Hospital | Payer: BC Managed Care – PPO | Attending: Surgery | Admitting: Surgery

## 2020-05-26 DIAGNOSIS — Z79899 Other long term (current) drug therapy: Secondary | ICD-10-CM | POA: Diagnosis not present

## 2020-05-26 DIAGNOSIS — J1282 Pneumonia due to coronavirus disease 2019: Secondary | ICD-10-CM | POA: Diagnosis present

## 2020-05-26 DIAGNOSIS — U071 COVID-19: Secondary | ICD-10-CM | POA: Diagnosis present

## 2020-05-26 DIAGNOSIS — F333 Major depressive disorder, recurrent, severe with psychotic symptoms: Secondary | ICD-10-CM | POA: Diagnosis present

## 2020-05-26 DIAGNOSIS — X781XXA Intentional self-harm by knife, initial encounter: Secondary | ICD-10-CM | POA: Diagnosis present

## 2020-05-26 DIAGNOSIS — S1191XA Laceration without foreign body of unspecified part of neck, initial encounter: Secondary | ICD-10-CM | POA: Diagnosis present

## 2020-05-26 DIAGNOSIS — J9601 Acute respiratory failure with hypoxia: Secondary | ICD-10-CM | POA: Diagnosis not present

## 2020-05-26 DIAGNOSIS — D62 Acute posthemorrhagic anemia: Secondary | ICD-10-CM | POA: Diagnosis present

## 2020-05-26 DIAGNOSIS — M7989 Other specified soft tissue disorders: Secondary | ICD-10-CM | POA: Diagnosis not present

## 2020-05-26 DIAGNOSIS — T1491XA Suicide attempt, initial encounter: Secondary | ICD-10-CM

## 2020-05-26 DIAGNOSIS — S15211A Minor laceration of right external jugular vein, initial encounter: Secondary | ICD-10-CM | POA: Diagnosis present

## 2020-05-26 DIAGNOSIS — I82612 Acute embolism and thrombosis of superficial veins of left upper extremity: Secondary | ICD-10-CM | POA: Diagnosis not present

## 2020-05-26 DIAGNOSIS — R1110 Vomiting, unspecified: Secondary | ICD-10-CM | POA: Diagnosis not present

## 2020-05-26 DIAGNOSIS — E119 Type 2 diabetes mellitus without complications: Secondary | ICD-10-CM | POA: Diagnosis present

## 2020-05-26 DIAGNOSIS — S1181XA Laceration without foreign body of other specified part of neck, initial encounter: Principal | ICD-10-CM | POA: Diagnosis present

## 2020-05-26 DIAGNOSIS — F4321 Adjustment disorder with depressed mood: Secondary | ICD-10-CM | POA: Diagnosis present

## 2020-05-26 DIAGNOSIS — S158XXA Injury of other specified blood vessels at neck level, initial encounter: Secondary | ICD-10-CM | POA: Diagnosis present

## 2020-05-26 DIAGNOSIS — I959 Hypotension, unspecified: Secondary | ICD-10-CM | POA: Diagnosis present

## 2020-05-26 DIAGNOSIS — Z9911 Dependence on respirator [ventilator] status: Secondary | ICD-10-CM

## 2020-05-26 DIAGNOSIS — Z7984 Long term (current) use of oral hypoglycemic drugs: Secondary | ICD-10-CM | POA: Diagnosis not present

## 2020-05-26 DIAGNOSIS — Z818 Family history of other mental and behavioral disorders: Secondary | ICD-10-CM | POA: Diagnosis not present

## 2020-05-26 DIAGNOSIS — T1490XA Injury, unspecified, initial encounter: Secondary | ICD-10-CM

## 2020-05-26 DIAGNOSIS — M79609 Pain in unspecified limb: Secondary | ICD-10-CM | POA: Diagnosis not present

## 2020-05-26 DIAGNOSIS — S199XXA Unspecified injury of neck, initial encounter: Secondary | ICD-10-CM | POA: Diagnosis not present

## 2020-05-26 DIAGNOSIS — T148XXA Other injury of unspecified body region, initial encounter: Secondary | ICD-10-CM

## 2020-05-26 DIAGNOSIS — R0602 Shortness of breath: Secondary | ICD-10-CM

## 2020-05-26 HISTORY — DX: Suicide attempt, initial encounter: T14.91XA

## 2020-05-26 HISTORY — PX: WOUND EXPLORATION: SHX6188

## 2020-05-26 HISTORY — DX: COVID-19: U07.1

## 2020-05-26 HISTORY — DX: Major depressive disorder, recurrent, severe with psychotic symptoms: F33.3

## 2020-05-26 HISTORY — DX: Acute respiratory failure with hypoxia: J96.01

## 2020-05-26 HISTORY — DX: Laceration without foreign body of unspecified part of neck, initial encounter: S11.91XA

## 2020-05-26 LAB — DIC (DISSEMINATED INTRAVASCULAR COAGULATION)PANEL
D-Dimer, Quant: 0.33 ug/mL-FEU (ref 0.00–0.50)
Fibrinogen: 172 mg/dL — ABNORMAL LOW (ref 210–475)
INR: 1.3 — ABNORMAL HIGH (ref 0.8–1.2)
Platelets: 232 10*3/uL (ref 150–400)
Prothrombin Time: 15.9 seconds — ABNORMAL HIGH (ref 11.4–15.2)
Smear Review: NONE SEEN
aPTT: 27 seconds (ref 24–36)

## 2020-05-26 LAB — POCT I-STAT 7, (LYTES, BLD GAS, ICA,H+H)
Acid-Base Excess: 4 mmol/L — ABNORMAL HIGH (ref 0.0–2.0)
Acid-base deficit: 1 mmol/L (ref 0.0–2.0)
Bicarbonate: 25 mmol/L (ref 20.0–28.0)
Bicarbonate: 30.6 mmol/L — ABNORMAL HIGH (ref 20.0–28.0)
Calcium, Ion: 0.58 mmol/L — CL (ref 1.15–1.40)
Calcium, Ion: 1.38 mmol/L (ref 1.15–1.40)
HCT: 31 % — ABNORMAL LOW (ref 39.0–52.0)
HCT: 29 % — ABNORMAL LOW (ref 39.0–52.0)
Hemoglobin: 10.5 g/dL — ABNORMAL LOW (ref 13.0–17.0)
Hemoglobin: 9.9 g/dL — ABNORMAL LOW (ref 13.0–17.0)
O2 Saturation: 100 %
O2 Saturation: 100 %
Patient temperature: 35
Patient temperature: 98.9
Potassium: 4.1 mmol/L (ref 3.5–5.1)
Potassium: 3.9 mmol/L (ref 3.5–5.1)
Sodium: 141 mmol/L (ref 135–145)
Sodium: 140 mmol/L (ref 135–145)
TCO2: 26 mmol/L (ref 22–32)
TCO2: 32 mmol/L (ref 22–32)
pCO2 arterial: 42.8 mmHg (ref 32.0–48.0)
pCO2 arterial: 56.1 mmHg — ABNORMAL HIGH (ref 32.0–48.0)
pH, Arterial: 7.365 (ref 7.350–7.450)
pH, Arterial: 7.345 — ABNORMAL LOW (ref 7.350–7.450)
pO2, Arterial: 252 mmHg — ABNORMAL HIGH (ref 83.0–108.0)
pO2, Arterial: 377 mmHg — ABNORMAL HIGH (ref 83.0–108.0)

## 2020-05-26 LAB — CBC
HCT: 40.8 % (ref 39.0–52.0)
HCT: 30.9 % — ABNORMAL LOW (ref 39.0–52.0)
HCT: 30.6 % — ABNORMAL LOW (ref 39.0–52.0)
Hemoglobin: 13.3 g/dL (ref 13.0–17.0)
Hemoglobin: 10.5 g/dL — ABNORMAL LOW (ref 13.0–17.0)
Hemoglobin: 10.5 g/dL — ABNORMAL LOW (ref 13.0–17.0)
MCH: 30.2 pg (ref 26.0–34.0)
MCH: 30 pg (ref 26.0–34.0)
MCH: 30.2 pg (ref 26.0–34.0)
MCHC: 32.6 g/dL (ref 30.0–36.0)
MCHC: 34 g/dL (ref 30.0–36.0)
MCHC: 34.3 g/dL (ref 30.0–36.0)
MCV: 92.7 fL (ref 80.0–100.0)
MCV: 88.3 fL (ref 80.0–100.0)
MCV: 87.9 fL (ref 80.0–100.0)
Platelets: 295 10*3/uL (ref 150–400)
Platelets: 169 10*3/uL (ref 150–400)
Platelets: 149 10*3/uL — ABNORMAL LOW (ref 150–400)
RBC: 4.4 MIL/uL (ref 4.22–5.81)
RBC: 3.5 MIL/uL — ABNORMAL LOW (ref 4.22–5.81)
RBC: 3.48 MIL/uL — ABNORMAL LOW (ref 4.22–5.81)
RDW: 12.3 % (ref 11.5–15.5)
RDW: 14.7 % (ref 11.5–15.5)
RDW: 15.2 % (ref 11.5–15.5)
WBC: 7.9 10*3/uL (ref 4.0–10.5)
WBC: 9.4 10*3/uL (ref 4.0–10.5)
WBC: 7.7 10*3/uL (ref 4.0–10.5)
nRBC: 0 % (ref 0.0–0.2)
nRBC: 0 % (ref 0.0–0.2)
nRBC: 0 % (ref 0.0–0.2)

## 2020-05-26 LAB — GLUCOSE, CAPILLARY
Glucose-Capillary: 283 mg/dL — ABNORMAL HIGH (ref 70–99)
Glucose-Capillary: 160 mg/dL — ABNORMAL HIGH (ref 70–99)
Glucose-Capillary: 155 mg/dL — ABNORMAL HIGH (ref 70–99)
Glucose-Capillary: 119 mg/dL — ABNORMAL HIGH (ref 70–99)

## 2020-05-26 LAB — COMPREHENSIVE METABOLIC PANEL
ALT: 41 U/L (ref 0–44)
ALT: 51 U/L — ABNORMAL HIGH (ref 0–44)
AST: 25 U/L (ref 15–41)
AST: 58 U/L — ABNORMAL HIGH (ref 15–41)
Albumin: 3.4 g/dL — ABNORMAL LOW (ref 3.5–5.0)
Albumin: 3.1 g/dL — ABNORMAL LOW (ref 3.5–5.0)
Alkaline Phosphatase: 39 U/L (ref 38–126)
Alkaline Phosphatase: 36 U/L — ABNORMAL LOW (ref 38–126)
Anion gap: 11 (ref 5–15)
Anion gap: 10 (ref 5–15)
BUN: 12 mg/dL (ref 6–20)
BUN: 12 mg/dL (ref 6–20)
CO2: 24 mmol/L (ref 22–32)
CO2: 25 mmol/L (ref 22–32)
Calcium: 8.7 mg/dL — ABNORMAL LOW (ref 8.9–10.3)
Calcium: 9.4 mg/dL (ref 8.9–10.3)
Chloride: 104 mmol/L (ref 98–111)
Chloride: 105 mmol/L (ref 98–111)
Creatinine, Ser: 1.23 mg/dL (ref 0.61–1.24)
Creatinine, Ser: 1 mg/dL (ref 0.61–1.24)
GFR, Estimated: >60 mL/min (ref >60)
GFR, Estimated: >60 mL/min (ref >60)
Glucose, Bld: 175 mg/dL — ABNORMAL HIGH (ref 70–99)
Glucose, Bld: 249 mg/dL — ABNORMAL HIGH (ref 70–99)
Potassium: 3.6 mmol/L (ref 3.5–5.1)
Potassium: 3.8 mmol/L (ref 3.5–5.1)
Sodium: 139 mmol/L (ref 135–145)
Sodium: 140 mmol/L (ref 135–145)
Total Bilirubin: 0.6 mg/dL (ref 0.3–1.2)
Total Bilirubin: 3.6 mg/dL — ABNORMAL HIGH (ref 0.3–1.2)
Total Protein: 6.3 g/dL — ABNORMAL LOW (ref 6.5–8.1)
Total Protein: 5.2 g/dL — ABNORMAL LOW (ref 6.5–8.1)

## 2020-05-26 LAB — TRIGLYCERIDES: Triglycerides: 171 mg/dL — ABNORMAL HIGH (ref <150)

## 2020-05-26 LAB — GLOBAL TEG PANEL
CFF Max Amplitude: 18.3 mm (ref 15–32)
CK with Heparinase (R): 3.7 min — ABNORMAL LOW (ref 4.3–8.3)
Citrated Functional Fibrinogen: 333.9 mg/dL (ref 278–581)
Citrated Kaolin (K): 1.1 min (ref 0.8–2.1)
Citrated Kaolin (MA): 59.1 mm (ref 52–69)
Citrated Kaolin (R): 3.7 min — ABNORMAL LOW (ref 4.6–9.1)
Citrated Kaolin Angle: 75.1 deg (ref 63–78)
Citrated Rapid TEG (MA): 58.5 mm (ref 52–70)

## 2020-05-26 LAB — BPAM CRYOPRECIPITATE
Blood Product Expiration Date: 202111040720
Blood Product Expiration Date: 202111040732
ISSUE DATE / TIME: 202111040140
ISSUE DATE / TIME: 202111040147
Unit Type and Rh: 5100
Unit Type and Rh: 5100

## 2020-05-26 LAB — URINALYSIS, ROUTINE W REFLEX MICROSCOPIC
Bilirubin Urine: NEGATIVE
Glucose, UA: 150 mg/dL — AB
Hgb urine dipstick: NEGATIVE
Ketones, ur: 5 mg/dL — AB
Leukocytes,Ua: NEGATIVE
Nitrite: NEGATIVE
Protein, ur: NEGATIVE mg/dL
Specific Gravity, Urine: 1.017 (ref 1.005–1.030)
pH: 5 (ref 5.0–8.0)

## 2020-05-26 LAB — BLOOD PRODUCT ORDER (VERBAL) VERIFICATION

## 2020-05-26 LAB — PREPARE CRYOPRECIPITATE
Unit division: 0
Unit division: 0

## 2020-05-26 LAB — MRSA PCR SCREENING: MRSA by PCR: NEGATIVE

## 2020-05-26 LAB — PROTIME-INR
INR: 1.2 (ref 0.8–1.2)
Prothrombin Time: 14.4 seconds (ref 11.4–15.2)

## 2020-05-26 LAB — ETHANOL: Alcohol, Ethyl (B): <10 mg/dL (ref <10)

## 2020-05-26 LAB — RESP PANEL BY RT PCR (RSV, FLU A&B, COVID)
Influenza A by PCR: NEGATIVE
Influenza B by PCR: NEGATIVE
Respiratory Syncytial Virus by PCR: NEGATIVE
SARS Coronavirus 2 by RT PCR: POSITIVE — AB

## 2020-05-26 LAB — ABO/RH: ABO/RH(D): B POS

## 2020-05-26 LAB — HEMOGLOBIN A1C
Hgb A1c MFr Bld: 6.4 % — ABNORMAL HIGH (ref 4.8–5.6)
Mean Plasma Glucose: 136.98 mg/dL

## 2020-05-26 LAB — HIV ANTIBODY (ROUTINE TESTING W REFLEX): HIV Screen 4th Generation wRfx: NONREACTIVE

## 2020-05-26 SURGERY — WOUND EXPLORATION
Anesthesia: General | Site: Neck

## 2020-05-26 MED ORDER — FENTANYL CITRATE (PF) 100 MCG/2ML IJ SOLN
50.0000 ug | Freq: Once | INTRAMUSCULAR | Status: AC
  Administered 2020-05-26: 50 ug via INTRAVENOUS

## 2020-05-26 MED ORDER — SODIUM CHLORIDE 0.9 % IV SOLN: 250.0000 mL | INTRAVENOUS | Status: DC | PRN

## 2020-05-26 MED ORDER — ALBUMIN HUMAN 5 % IV SOLN: INTRAVENOUS | Status: DC | PRN

## 2020-05-26 MED ORDER — SODIUM CHLORIDE 0.9 % IV BOLUS
1000.0000 mL | Freq: Once | INTRAVENOUS | Status: AC | PRN
  Administered 2020-05-26: 1000 mL via INTRAVENOUS

## 2020-05-26 MED ORDER — OXYCODONE HCL 5 MG PO TABS: 5.0000 mg | ORAL_TABLET | ORAL | Status: DC | PRN

## 2020-05-26 MED ORDER — PANTOPRAZOLE SODIUM 40 MG IV SOLR
40.0000 mg | INTRAVENOUS | Status: DC
  Administered 2020-05-26 – 2020-05-30 (×5): 40 mg via INTRAVENOUS
  Filled 2020-05-26 (×5): qty 40

## 2020-05-26 MED ORDER — CHLORHEXIDINE GLUCONATE 0.12% ORAL RINSE (MEDLINE KIT)
15.0000 mL | Freq: Two times a day (BID) | OROMUCOSAL | Status: DC
  Administered 2020-05-26 – 2020-06-04 (×15): 15 mL via OROMUCOSAL

## 2020-05-26 MED ORDER — CALCIUM CHLORIDE 10 % IV SOLN
INTRAVENOUS | Status: DC | PRN
  Administered 2020-05-26: 200 mg via INTRAVENOUS
  Administered 2020-05-26: 400 mg via INTRAVENOUS
  Administered 2020-05-26 (×3): 200 mg via INTRAVENOUS
  Administered 2020-05-26: 300 mg via INTRAVENOUS
  Administered 2020-05-26: 200 mg via INTRAVENOUS
  Administered 2020-05-26: 300 mg via INTRAVENOUS

## 2020-05-26 MED ORDER — ETOMIDATE 2 MG/ML IV SOLN
INTRAVENOUS | Status: AC | PRN
  Administered 2020-05-26: 30 mg via INTRAVENOUS

## 2020-05-26 MED ORDER — IOHEXOL 350 MG/ML SOLN
100.0000 mL | Freq: Once | INTRAVENOUS | Status: AC | PRN
  Administered 2020-05-26: 100 mL via INTRAVENOUS

## 2020-05-26 MED ORDER — POLYETHYLENE GLYCOL 3350 17 G PO PACK
17.0000 g | PACK | Freq: Every day | ORAL | Status: DC
  Administered 2020-05-27: 17 g via ORAL
  Filled 2020-05-26: qty 1

## 2020-05-26 MED ORDER — PROPOFOL 500 MG/50ML IV EMUL
INTRAVENOUS | Status: DC | PRN
  Administered 2020-05-26: 50 ug/kg/min via INTRAVENOUS

## 2020-05-26 MED ORDER — LABETALOL HCL 5 MG/ML IV SOLN
INTRAVENOUS | Status: DC | PRN
  Administered 2020-05-26 (×2): 20 mg via INTRAVENOUS

## 2020-05-26 MED ORDER — LACTATED RINGERS IV SOLN: INTRAVENOUS | Status: DC | PRN

## 2020-05-26 MED ORDER — CEFAZOLIN SODIUM-DEXTROSE 2-3 GM-%(50ML) IV SOLR
INTRAVENOUS | Status: DC | PRN
  Administered 2020-05-26: 2 g via INTRAVENOUS

## 2020-05-26 MED ORDER — ONDANSETRON HCL 4 MG/2ML IJ SOLN: 4.0000 mg | Freq: Four times a day (QID) | INTRAMUSCULAR | Status: DC | PRN

## 2020-05-26 MED ORDER — CHLORHEXIDINE GLUCONATE CLOTH 2 % EX PADS
6.0000 | MEDICATED_PAD | Freq: Every day | CUTANEOUS | Status: DC
  Administered 2020-05-27 – 2020-06-04 (×7): 6 via TOPICAL

## 2020-05-26 MED ORDER — INSULIN ASPART 100 UNIT/ML ~~LOC~~ SOLN
0.0000 [IU] | Freq: Three times a day (TID) | SUBCUTANEOUS | Status: DC
  Administered 2020-05-26: 3 [IU] via SUBCUTANEOUS
  Administered 2020-05-27: 2 [IU] via SUBCUTANEOUS
  Administered 2020-05-27: 5 [IU] via SUBCUTANEOUS
  Administered 2020-05-27: 3 [IU] via SUBCUTANEOUS

## 2020-05-26 MED ORDER — CEFAZOLIN SODIUM-DEXTROSE 1-4 GM/50ML-% IV SOLN
1.0000 g | Freq: Three times a day (TID) | INTRAVENOUS | Status: DC
  Administered 2020-05-26 – 2020-05-27 (×4): 1 g via INTRAVENOUS
  Filled 2020-05-26 (×5): qty 50

## 2020-05-26 MED ORDER — ROCURONIUM BROMIDE 50 MG/5ML IV SOLN
INTRAVENOUS | Status: AC | PRN
  Administered 2020-05-26: 100 mg via INTRAVENOUS

## 2020-05-26 MED ORDER — PROPOFOL 1000 MG/100ML IV EMUL
0.0000 ug/kg/min | INTRAVENOUS | Status: DC
  Administered 2020-05-26: 34.977 ug/kg/min via INTRAVENOUS
  Administered 2020-05-26: 40 ug/kg/min via INTRAVENOUS
  Administered 2020-05-26: 35 ug/kg/min via INTRAVENOUS
  Filled 2020-05-26 (×2): qty 100

## 2020-05-26 MED ORDER — SODIUM CHLORIDE 0.9% FLUSH: 3.0000 mL | INTRAVENOUS | Status: DC | PRN

## 2020-05-26 MED ORDER — ENOXAPARIN SODIUM 30 MG/0.3ML ~~LOC~~ SOLN: 30.0000 mg | Freq: Two times a day (BID) | SUBCUTANEOUS | Status: DC

## 2020-05-26 MED ORDER — MIDAZOLAM HCL 2 MG/2ML IJ SOLN
INTRAMUSCULAR | Status: DC | PRN
  Administered 2020-05-26: 2 mg via INTRAVENOUS

## 2020-05-26 MED ORDER — ORAL CARE MOUTH RINSE
15.0000 mL | OROMUCOSAL | Status: DC
  Administered 2020-05-26 (×4): 15 mL via OROMUCOSAL

## 2020-05-26 MED ORDER — PHENYLEPHRINE 40 MCG/ML (10ML) SYRINGE FOR IV PUSH (FOR BLOOD PRESSURE SUPPORT)
PREFILLED_SYRINGE | INTRAVENOUS | Status: DC | PRN
  Administered 2020-05-26: 80 ug via INTRAVENOUS

## 2020-05-26 MED ORDER — FENTANYL CITRATE (PF) 250 MCG/5ML IJ SOLN
INTRAMUSCULAR | Status: DC | PRN
  Administered 2020-05-26: 50 ug via INTRAVENOUS
  Administered 2020-05-26 (×2): 100 ug via INTRAVENOUS

## 2020-05-26 MED ORDER — FENTANYL BOLUS VIA INFUSION
50.0000 ug | INTRAVENOUS | Status: DC | PRN
  Administered 2020-05-26: 50 ug via INTRAVENOUS
  Filled 2020-05-26: qty 50

## 2020-05-26 MED ORDER — ROCURONIUM BROMIDE 10 MG/ML (PF) SYRINGE
PREFILLED_SYRINGE | INTRAVENOUS | Status: DC | PRN
  Administered 2020-05-26 (×2): 50 mg via INTRAVENOUS

## 2020-05-26 MED ORDER — 0.9 % SODIUM CHLORIDE (POUR BTL) OPTIME
TOPICAL | Status: DC | PRN
  Administered 2020-05-26: 1000 mL

## 2020-05-26 MED ORDER — ONDANSETRON 4 MG PO TBDP: 4.0000 mg | ORAL_TABLET | Freq: Four times a day (QID) | ORAL | Status: DC | PRN

## 2020-05-26 MED ORDER — PHENYLEPHRINE HCL-NACL 10-0.9 MG/250ML-% IV SOLN
INTRAVENOUS | Status: DC | PRN
  Administered 2020-05-26: 25 ug/min via INTRAVENOUS

## 2020-05-26 MED ORDER — DOCUSATE SODIUM 50 MG/5ML PO LIQD
100.0000 mg | Freq: Two times a day (BID) | ORAL | Status: DC
  Administered 2020-05-26 – 2020-05-27 (×2): 100 mg via ORAL
  Filled 2020-05-26 (×2): qty 10

## 2020-05-26 MED ORDER — SODIUM CHLORIDE 0.9 % IV SOLN: INTRAVENOUS | Status: DC | PRN

## 2020-05-26 MED ORDER — SODIUM CHLORIDE 0.9% FLUSH
3.0000 mL | Freq: Two times a day (BID) | INTRAVENOUS | Status: DC
  Administered 2020-05-26 – 2020-05-27 (×4): 3 mL via INTRAVENOUS

## 2020-05-26 MED ORDER — FENTANYL 2500MCG IN NS 250ML (10MCG/ML) PREMIX INFUSION
50.0000 ug/h | INTRAVENOUS | Status: DC
  Administered 2020-05-26: 200 ug/h via INTRAVENOUS
  Filled 2020-05-26 (×2): qty 250

## 2020-05-26 MED ORDER — ACETAMINOPHEN 325 MG PO TABS
650.0000 mg | ORAL_TABLET | Freq: Four times a day (QID) | ORAL | Status: DC | PRN
  Administered 2020-05-26 – 2020-05-27 (×2): 650 mg via ORAL
  Filled 2020-05-26 (×2): qty 2

## 2020-05-26 MED ORDER — FENTANYL CITRATE (PF) 250 MCG/5ML IJ SOLN
INTRAMUSCULAR | Status: AC
  Filled 2020-05-26: qty 5

## 2020-05-26 MED ORDER — METOPROLOL TARTRATE 5 MG/5ML IV SOLN: 5.0000 mg | Freq: Four times a day (QID) | INTRAVENOUS | Status: DC | PRN

## 2020-05-26 MED ORDER — TETANUS-DIPHTH-ACELL PERTUSSIS 5-2.5-18.5 LF-MCG/0.5 IM SUSY
0.5000 mL | PREFILLED_SYRINGE | Freq: Once | INTRAMUSCULAR | Status: DC
  Filled 2020-05-26: qty 0.5

## 2020-05-26 MED ORDER — FENTANYL CITRATE (PF) 100 MCG/2ML IJ SOLN
INTRAMUSCULAR | Status: AC
  Filled 2020-05-26: qty 2

## 2020-05-26 MED ORDER — MIDAZOLAM HCL 2 MG/2ML IJ SOLN
INTRAMUSCULAR | Status: AC
  Filled 2020-05-26: qty 2

## 2020-05-26 MED ORDER — POTASSIUM CHLORIDE IN NACL 20-0.9 MEQ/L-% IV SOLN
INTRAVENOUS | Status: DC
  Filled 2020-05-26 (×3): qty 1000

## 2020-05-26 SURGICAL SUPPLY — 43 items
APL PRP STRL LF DISP 70% ISPRP (MISCELLANEOUS)
BLADE CLIPPER SURG (BLADE) IMPLANT
BNDG GAUZE ELAST 4 BULKY (GAUZE/BANDAGES/DRESSINGS) ×3 IMPLANT
CANISTER SUCT 3000ML PPV (MISCELLANEOUS) ×3 IMPLANT
CHLORAPREP W/TINT 26 (MISCELLANEOUS) IMPLANT
CLIP VESOCCLUDE MED 6/CT (CLIP) ×3 IMPLANT
COVER SURGICAL LIGHT HANDLE (MISCELLANEOUS) ×6 IMPLANT
COVER WAND RF STERILE (DRAPES) ×3 IMPLANT
DRAIN PENROSE 1/2X12 LTX STRL (WOUND CARE) ×3 IMPLANT
DRAPE LAPAROSCOPIC ABDOMINAL (DRAPES) IMPLANT
DRAPE LAPAROTOMY 100X72 PEDS (DRAPES) IMPLANT
DRAPE U-SHAPE 76X120 STRL (DRAPES) ×3 IMPLANT
DRSG PAD ABDOMINAL 8X10 ST (GAUZE/BANDAGES/DRESSINGS) IMPLANT
ELECT CAUTERY BLADE 6.4 (BLADE) IMPLANT
ELECT REM PT RETURN 9FT ADLT (ELECTROSURGICAL) ×3
ELECTRODE REM PT RTRN 9FT ADLT (ELECTROSURGICAL) ×1 IMPLANT
GAUZE SPONGE 4X4 12PLY STRL (GAUZE/BANDAGES/DRESSINGS) ×3 IMPLANT
GLOVE BIO SURGEON STRL SZ7 (GLOVE) ×3 IMPLANT
GLOVE BIOGEL PI IND STRL 7.5 (GLOVE) ×1 IMPLANT
GLOVE BIOGEL PI INDICATOR 7.5 (GLOVE) ×2
GOWN STRL REUS W/ TWL LRG LVL3 (GOWN DISPOSABLE) ×2 IMPLANT
GOWN STRL REUS W/TWL LRG LVL3 (GOWN DISPOSABLE) ×6
KIT BASIN OR (CUSTOM PROCEDURE TRAY) ×3 IMPLANT
KIT TURNOVER KIT B (KITS) ×3 IMPLANT
LOOP VESSEL MAXI BLUE (MISCELLANEOUS) ×3 IMPLANT
LOOP VESSEL MINI RED (MISCELLANEOUS) ×3 IMPLANT
NS IRRIG 1000ML POUR BTL (IV SOLUTION) ×3 IMPLANT
PACK GENERAL/GYN (CUSTOM PROCEDURE TRAY) ×3 IMPLANT
PAD ARMBOARD 7.5X6 YLW CONV (MISCELLANEOUS) ×3 IMPLANT
PENCIL SMOKE EVACUATOR (MISCELLANEOUS) ×3 IMPLANT
SPONGE LAP 18X18 RF (DISPOSABLE) ×9 IMPLANT
SUT ETHILON 2 0 FS 18 (SUTURE) ×3 IMPLANT
SUT PROLENE 4 0 PS 2 18 (SUTURE) ×6 IMPLANT
SUT SILK 2 0 (SUTURE) ×3
SUT SILK 2-0 18XBRD TIE 12 (SUTURE) ×1 IMPLANT
SUT SILK 3 0 (SUTURE)
SUT SILK 3-0 18XBRD TIE 12 (SUTURE) IMPLANT
SUT VIC AB 3-0 SH 27 (SUTURE) ×3
SUT VIC AB 3-0 SH 27XBRD (SUTURE) ×1 IMPLANT
SWAB COLLECTION DEVICE MRSA (MISCELLANEOUS) IMPLANT
SWAB CULTURE ESWAB REG 1ML (MISCELLANEOUS) IMPLANT
TOWEL GREEN STERILE (TOWEL DISPOSABLE) ×3 IMPLANT
TOWEL GREEN STERILE FF (TOWEL DISPOSABLE) ×3 IMPLANT
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
## 2020-05-26 NOTE — ED Notes (Signed)
Verified with Alberto RN 
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
## 2020-05-26 NOTE — Progress Notes (Signed)
RN spoke with and updated wife Malvern Kadlec (provided patient's name and birthday) on patient's status. When RN asked if patient had been exposed to COVID wife informed RN a member of the household tested positive 3 weeks ago however she did not believe patient had or was exhibiting any symptoms. RN informed the spouse due to testing positive patient was in isolation and would not be allowed any visitors at this time. Patient's spouse understood and was given the unit's number to call for updates at any time. Wife set up the password "[place]" and RN strongly encouraged spouse not to give password to anyone else and to update the family vs. having multiple family members calling for updates on this sensitive matter. RN also asked spouse if patient was on in medications at home. Spouse knew patient was on Metformin and a few others but could not remember names or doses. Spouse given unit's direct number and encouraged to gather medications and call to let patient's RN know medications and doses at earliest convenience.   Lin Landsman RN
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
## 2020-05-26 NOTE — Transfer of Care (Signed)
Immediate Anesthesia Transfer of Care Note  Patient: George Hobbs  Procedure(s) Performed: NECK WOUND EXPLORATION (N/A Neck)  Patient Location: ICU  Anesthesia Type:General  Level of Consciousness: Patient remains intubated per anesthesia plan  Airway & Oxygen Therapy: Patient remains intubated per anesthesia plan  Post-op Assessment: Report given to RN and Post -op Vital signs reviewed and stable  Post vital signs: Reviewed and stable  Last Vitals:  Vitals Value Taken Time  BP 163/106 05/26/20 0234  Temp    Pulse 84 05/26/20 0245  Resp 15 05/26/20 0249  SpO2 82 % 05/26/20 0245  Vitals shown include unvalidated device data.  Last Pain:  Vitals:   05/26/20 0013  TempSrc: Temporal         Complications: No complications documented.
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
## 2020-05-26 NOTE — Progress Notes (Addendum)
Patient's wife Cheston Coury) and mother Westlee Devita) were updated on patient status. All questions were answered at this time.  RN given permission to assist with video call with the patient's mom, Jonuel Butterfield.
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
## 2020-05-26 NOTE — Progress Notes (Signed)
Assisted tele visit to patient with mother.  Gwynn Crossley P, RN  

## 2020-05-26 NOTE — ED Notes (Signed)
Activated MTP--Mixtli Reno  

## 2020-05-26 NOTE — Progress Notes (Signed)
Orthopedic Tech Progress Note Patient Details:  George Hobbs 07/23/1876 [id number] Level 1 Trauma  Patient ID: George Hobbs, male   DOB: 07/23/1876, 40 y.o.   MRN: [id number]   Smitty Pluck 05/26/2020, 12:13 AM

## 2020-05-26 NOTE — ED Notes (Signed)
OR called & ready, pt to OR rm 1
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
## 2020-05-26 NOTE — ED Notes (Signed)
Verified with Richardean Canal

## 2020-05-26 NOTE — Procedures (Signed)
Extubation Procedure Note  Patient Details:   Name: George Hobbs DOB: 10-30-1980 MRN: [id number]   Airway Documentation:    Vent end date: 05/26/20 Vent end time: 1050   Evaluation  O2 sats: stable throughout Complications: No apparent complications Patient did tolerate procedure well. Bilateral Breath Sounds: Clear, Diminished   Pt extubated to 6L [place] per MD order. Pt had positive cuff leak and no stridor noted. Pt able to voice his name.  Guss Bunde 05/26/2020, 10:51 AM
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
## 2020-05-26 NOTE — Anesthesia Procedure Notes (Addendum)
Arterial Line Insertion Start/End11/10/2019 1:06 AM, 05/26/2020 1:10 AM Performed by: Atilano Median, DO, anesthesiologist  Patient location: OR. Preanesthetic checklist: patient identified, IV checked, site marked, risks and benefits discussed, surgical consent, monitors and equipment checked, pre-op evaluation, timeout performed and anesthesia consent Emergency situation Lidocaine 1% used for infiltration Left, radial was placed Catheter size: 20 Fr Hand hygiene performed  and maximum sterile barriers used   Attempts: 2 Procedure performed using ultrasound guided technique. Following insertion, dressing applied and Biopatch. Post procedure assessment: normal and unchanged

## 2020-05-26 NOTE — Progress Notes (Signed)
Assisted tele visit to patient with family member.  Indira Sorenson D Derreck Wiltsey, RN   

## 2020-05-27 ENCOUNTER — Inpatient Hospital Stay (HOSPITAL_COMMUNITY): Payer: BC Managed Care – PPO

## 2020-05-27 ENCOUNTER — Encounter (HOSPITAL_COMMUNITY): Payer: Self-pay | Admitting: Surgery

## 2020-05-27 ENCOUNTER — Encounter: Payer: Self-pay | Admitting: Internal Medicine

## 2020-05-27 DIAGNOSIS — F333 Major depressive disorder, recurrent, severe with psychotic symptoms: Secondary | ICD-10-CM | POA: Diagnosis present

## 2020-05-27 DIAGNOSIS — U071 COVID-19: Secondary | ICD-10-CM | POA: Diagnosis present

## 2020-05-27 DIAGNOSIS — S1191XA Laceration without foreign body of unspecified part of neck, initial encounter: Secondary | ICD-10-CM | POA: Diagnosis present

## 2020-05-27 LAB — HEPATIC FUNCTION PANEL
ALT: 40 U/L (ref 0–44)
AST: 30 U/L (ref 15–41)
Albumin: 3.2 g/dL — ABNORMAL LOW (ref 3.5–5.0)
Alkaline Phosphatase: 34 U/L — ABNORMAL LOW (ref 38–126)
Bilirubin, Direct: 0.2 mg/dL (ref 0.0–0.2)
Indirect Bilirubin: 0.4 mg/dL (ref 0.3–0.9)
Total Bilirubin: 0.6 mg/dL (ref 0.3–1.2)
Total Protein: 5.9 g/dL — ABNORMAL LOW (ref 6.5–8.1)

## 2020-05-27 LAB — CBC
HCT: 31.9 % — ABNORMAL LOW (ref 39.0–52.0)
Hemoglobin: 10.6 g/dL — ABNORMAL LOW (ref 13.0–17.0)
MCH: 29.6 pg (ref 26.0–34.0)
MCHC: 33.2 g/dL (ref 30.0–36.0)
MCV: 89.1 fL (ref 80.0–100.0)
Platelets: 139 10*3/uL — ABNORMAL LOW (ref 150–400)
RBC: 3.58 MIL/uL — ABNORMAL LOW (ref 4.22–5.81)
RDW: 14.9 % (ref 11.5–15.5)
WBC: 7.1 10*3/uL (ref 4.0–10.5)
nRBC: 0 % (ref 0.0–0.2)

## 2020-05-27 LAB — BPAM FFP
Blood Product Expiration Date: 202111042359
Blood Product Expiration Date: 202111042359
Blood Product Expiration Date: 202111042359
Blood Product Expiration Date: 202111042359
Blood Product Expiration Date: 202111072359
Blood Product Expiration Date: 202111072359
Blood Product Expiration Date: 202111082359
Blood Product Expiration Date: 202111082359
Blood Product Expiration Date: 202111092359
Blood Product Expiration Date: 202111092359
Blood Product Expiration Date: 202111092359
Blood Product Expiration Date: 202111092359
ISSUE DATE / TIME: 202111040044
ISSUE DATE / TIME: 202111040044
ISSUE DATE / TIME: 202111040045
ISSUE DATE / TIME: 202111040045
ISSUE DATE / TIME: 202111040048
ISSUE DATE / TIME: 202111040048
ISSUE DATE / TIME: 202111040103
ISSUE DATE / TIME: 202111040103
ISSUE DATE / TIME: 202111040138
ISSUE DATE / TIME: 202111040138
ISSUE DATE / TIME: 202111040138
ISSUE DATE / TIME: 202111040138
Unit Type and Rh: 1700
Unit Type and Rh: 6200
Unit Type and Rh: 6200
Unit Type and Rh: 6200
Unit Type and Rh: 6200
Unit Type and Rh: 6200
Unit Type and Rh: 6200
Unit Type and Rh: 7300
Unit Type and Rh: 7300
Unit Type and Rh: 7300
Unit Type and Rh: 7300
Unit Type and Rh: 7300

## 2020-05-27 LAB — PREPARE FRESH FROZEN PLASMA
Unit division: 0
Unit division: 0
Unit division: 0
Unit division: 0
Unit division: 0
Unit division: 0
Unit division: 0
Unit division: 0
Unit division: 0
Unit division: 0

## 2020-05-27 LAB — BPAM PLATELET PHERESIS
Blood Product Expiration Date: 202111042359
Blood Product Expiration Date: 202111042359
ISSUE DATE / TIME: 202111040117
ISSUE DATE / TIME: 202111040134
Unit Type and Rh: 5100
Unit Type and Rh: 600

## 2020-05-27 LAB — PREPARE PLATELET PHERESIS
Unit division: 0
Unit division: 0

## 2020-05-27 LAB — LACTATE DEHYDROGENASE: LDH: 209 U/L — ABNORMAL HIGH (ref 98–192)

## 2020-05-27 LAB — C-REACTIVE PROTEIN: CRP: 12.3 mg/dL — ABNORMAL HIGH (ref <1.0)

## 2020-05-27 LAB — FERRITIN: Ferritin: 920 ng/mL — ABNORMAL HIGH (ref 24–336)

## 2020-05-27 LAB — GLUCOSE, CAPILLARY
Glucose-Capillary: 131 mg/dL — ABNORMAL HIGH (ref 70–99)
Glucose-Capillary: 169 mg/dL — ABNORMAL HIGH (ref 70–99)
Glucose-Capillary: 206 mg/dL — ABNORMAL HIGH (ref 70–99)
Glucose-Capillary: 223 mg/dL — ABNORMAL HIGH (ref 70–99)

## 2020-05-27 LAB — TRIGLYCERIDES: Triglycerides: 158 mg/dL — ABNORMAL HIGH (ref <150)

## 2020-05-27 LAB — BASIC METABOLIC PANEL
Anion gap: 8 (ref 5–15)
BUN: 5 mg/dL — ABNORMAL LOW (ref 6–20)
CO2: 28 mmol/L (ref 22–32)
Calcium: 8.5 mg/dL — ABNORMAL LOW (ref 8.9–10.3)
Chloride: 103 mmol/L (ref 98–111)
Creatinine, Ser: 0.81 mg/dL (ref 0.61–1.24)
GFR, Estimated: >60 mL/min (ref >60)
Glucose, Bld: 135 mg/dL — ABNORMAL HIGH (ref 70–99)
Potassium: 3.5 mmol/L (ref 3.5–5.1)
Sodium: 139 mmol/L (ref 135–145)

## 2020-05-27 LAB — D-DIMER, QUANTITATIVE: D-Dimer, Quant: 3.79 ug/mL-FEU — ABNORMAL HIGH (ref 0.00–0.50)

## 2020-05-27 LAB — PROCALCITONIN: Procalcitonin: 0.31 ng/mL

## 2020-05-27 LAB — FIBRINOGEN: Fibrinogen: 392 mg/dL (ref 210–475)

## 2020-05-27 MED ORDER — ACETAMINOPHEN 500 MG PO TABS
1000.0000 mg | ORAL_TABLET | Freq: Four times a day (QID) | ORAL | Status: DC
  Administered 2020-05-27 – 2020-06-04 (×25): 1000 mg via ORAL
  Administered 2020-06-04: 500 mg via ORAL
  Administered 2020-06-05 – 2020-06-07 (×9): 1000 mg via ORAL
  Filled 2020-05-27 (×38): qty 2

## 2020-05-27 MED ORDER — TRAZODONE HCL 50 MG PO TABS
50.0000 mg | ORAL_TABLET | Freq: Every day | ORAL | Status: DC
  Administered 2020-05-27 – 2020-06-06 (×11): 50 mg via ORAL
  Filled 2020-05-27 (×11): qty 1

## 2020-05-27 MED ORDER — DOCUSATE SODIUM 100 MG PO CAPS
100.0000 mg | ORAL_CAPSULE | Freq: Two times a day (BID) | ORAL | Status: DC
  Administered 2020-05-27 – 2020-06-07 (×13): 100 mg via ORAL
  Filled 2020-05-27 (×18): qty 1

## 2020-05-27 MED ORDER — GUAIFENESIN-DM 100-10 MG/5ML PO SYRP
10.0000 mL | ORAL_SOLUTION | ORAL | Status: DC | PRN
  Filled 2020-05-27: qty 10

## 2020-05-27 MED ORDER — SODIUM CHLORIDE 0.9 % IV SOLN
100.0000 mg | Freq: Every day | INTRAVENOUS | Status: AC
  Administered 2020-05-28 – 2020-05-31 (×4): 100 mg via INTRAVENOUS
  Filled 2020-05-27 (×4): qty 20

## 2020-05-27 MED ORDER — ZINC SULFATE 220 (50 ZN) MG PO CAPS
220.0000 mg | ORAL_CAPSULE | Freq: Every day | ORAL | Status: DC
  Administered 2020-05-27 – 2020-06-07 (×12): 220 mg via ORAL
  Filled 2020-05-27 (×12): qty 1

## 2020-05-27 MED ORDER — ARIPIPRAZOLE 2 MG PO TABS
2.0000 mg | ORAL_TABLET | Freq: Every day | ORAL | Status: DC
  Administered 2020-05-27 – 2020-06-07 (×12): 2 mg via ORAL
  Filled 2020-05-27 (×13): qty 1

## 2020-05-27 MED ORDER — HYDROCOD POLST-CPM POLST ER 10-8 MG/5ML PO SUER
5.0000 mL | Freq: Two times a day (BID) | ORAL | Status: DC | PRN
  Administered 2020-05-29: 5 mL via ORAL
  Filled 2020-05-27: qty 5

## 2020-05-27 MED ORDER — HYDROXYZINE HCL 25 MG PO TABS
25.0000 mg | ORAL_TABLET | Freq: Three times a day (TID) | ORAL | Status: DC
  Administered 2020-05-27 – 2020-05-31 (×15): 25 mg via ORAL
  Filled 2020-05-27 (×15): qty 1

## 2020-05-27 MED ORDER — ASCORBIC ACID 500 MG PO TABS
500.0000 mg | ORAL_TABLET | Freq: Every day | ORAL | Status: DC
  Administered 2020-05-27 – 2020-06-07 (×12): 500 mg via ORAL
  Filled 2020-05-27 (×12): qty 1

## 2020-05-27 MED ORDER — METHYLPREDNISOLONE SODIUM SUCC 125 MG IJ SOLR
0.5000 mg/kg | Freq: Two times a day (BID) | INTRAMUSCULAR | Status: DC
  Administered 2020-05-27 – 2020-05-28 (×2): 47.5 mg via INTRAVENOUS
  Filled 2020-05-27 (×2): qty 2

## 2020-05-27 MED ORDER — SODIUM CHLORIDE 0.9 % IV SOLN
200.0000 mg | Freq: Once | INTRAVENOUS | Status: AC
  Administered 2020-05-27: 200 mg via INTRAVENOUS
  Filled 2020-05-27: qty 40

## 2020-05-27 MED ORDER — MORPHINE SULFATE (PF) 2 MG/ML IV SOLN: 2.0000 mg | INTRAVENOUS | Status: DC | PRN

## 2020-05-27 MED ORDER — POLYETHYLENE GLYCOL 3350 17 G PO PACK
17.0000 g | PACK | Freq: Every day | ORAL | Status: DC
  Administered 2020-05-27: 17 g via ORAL
  Filled 2020-05-27: qty 1

## 2020-05-27 MED ORDER — FLUOXETINE HCL 20 MG PO CAPS
20.0000 mg | ORAL_CAPSULE | Freq: Every day | ORAL | Status: DC
  Administered 2020-05-27 – 2020-06-07 (×12): 20 mg via ORAL
  Filled 2020-05-27 (×6): qty 1
  Filled 2020-05-27: qty 2
  Filled 2020-05-27 (×5): qty 1

## 2020-05-27 MED ORDER — PREDNISONE 20 MG PO TABS: 50.0000 mg | ORAL_TABLET | Freq: Every day | ORAL | Status: DC

## 2020-05-27 MED ORDER — ENOXAPARIN SODIUM 30 MG/0.3ML ~~LOC~~ SOLN
30.0000 mg | Freq: Two times a day (BID) | SUBCUTANEOUS | Status: DC
  Administered 2020-05-27 – 2020-06-07 (×23): 30 mg via SUBCUTANEOUS
  Filled 2020-05-27 (×24): qty 0.3
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
## 2020-05-27 NOTE — Consult Note (Signed)
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 H+H)     Status: Abnormal   Collection Time: 05/26/20  3:49 AM  Result Value Ref Range   pH, Arterial 7.345 (L) 7.35 - 7.45   pCO2 arterial 56.1 (H) 32 - 48 mmHg   pO2, Arterial 377 (H) 83 - 108 mmHg   Bicarbonate 30.6 (H) 20.0 - 28.0 mmol/L   TCO2 32 22 - 32 mmol/L   O2 Saturation 100.0 %   Acid-Base Excess 4.0 (H) 0.0 - 2.0 mmol/L   Sodium 140 135 - 145 mmol/L   Potassium 3.9 3.5 - 5.1 mmol/L   Calcium, Ion 1.38 1.15 - 1.40 mmol/L   HCT 29.0 (L) 39 - 52 %    Hemoglobin 9.9 (L) 13.0 - 17.0 g/dL   Patient temperature 98.9 F    Collection site art line    Drawn by RT    Sample type ARTERIAL   Global TEG Panel     Status: Abnormal   Collection Time: 05/26/20  4:00 AM  Result Value Ref Range   Citrated Kaolin (R) 3.7 (L) 4.6 - 9.1 min   Citrated Kaolin (K) 1.1 0.8 - 2.1 min   Citrated Kaolin Angle 75.1 63 - 78 deg   Citrated Kaolin (MA) 59.1 52 - 69 mm   Citrated Rapid TEG (MA) 58.5 52 - 70 mm   CK with Heparinase (R) 3.7 (L) 4.3 - 8.3 min   CFF Max Amplitude 18.3 15 - 32 mm   Citrated Functional Fibrinogen 333.9 278 - 581 mg/dL    Comment: Performed at [facility], [place] [address] [place], [place] [address]  ABO/Rh     Status: None   Collection Time: 05/26/20  4:00 AM  Result Value Ref Range   ABO/RH(D)      B POS Performed at [facility] [address], [place], [place] [address]   HIV Antibody (routine testing w rflx)     Status: None   Collection Time: 05/26/20  4:00 AM  Result Value Ref Range   HIV Screen 4th Generation wRfx Non Reactive Non Reactive    Comment: Performed at [facility], [place] [address] [place], [place] [address]  CBC     Status: Abnormal   Collection Time: 05/26/20  4:00 AM  Result Value Ref Range   WBC 9.4 4.0 - 10.5 K/uL   RBC 3.50 (L) 4.22 - 5.81 MIL/uL   Hemoglobin 10.5 (L) 13.0 - 17.0 g/dL   HCT 30.9 (L) 39 - 52 %   MCV 88.3 80.0 - 100.0 fL   MCH 30.0 26.0 - 34.0 pg   MCHC 34.0 30.0 - 36.0 g/dL   RDW 14.7 11.5 - 15.5 %   Platelets 169 150 - 400 K/uL   nRBC 0.0 0.0 - 0.2 %    Comment: Performed at [facility], [place] [address], [place], [place] [address]  Comprehensive  metabolic panel     Status: Abnormal   Collection Time: 05/26/20  4:00 AM  Result Value Ref Range   Sodium 140 135 - 145 mmol/L   Potassium 3.8 3.5 - 5.1 mmol/L   Chloride 105 98 - 111 mmol/L   CO2 25 22 - 32 mmol/L   Glucose, Bld 249 (H) 70 - 99 mg/dL    Comment: Glucose reference range applies only to  samples taken after fasting for at least 8 hours.   BUN 12 6 - 20 mg/dL   Creatinine, Ser 1.00 0.61 - 1.24 mg/dL   Calcium 9.4 8.9 - 10.3 mg/dL   Total Protein 5.2 (L) 6.5 - 8.1 g/dL   Albumin 3.1 (L) 3.5 - 5.0 g/dL   AST 58 (H) 15 - 41 U/L   ALT 51 (H) 0 - 44 U/L   Alkaline Phosphatase 36 (L) 38 - 126 U/L   Total Bilirubin 3.6 (H) 0.3 - 1.2 mg/dL   GFR, Estimated >60 >60 mL/min    Comment: (NOTE) Calculated using the CKD-EPI Creatinine Equation (2021)    Anion gap 10 5 - 15    Comment: Performed at [facility], [place] [address] [place], [place] [address]  Hemoglobin A1c     Status: Abnormal   Collection Time: 05/26/20 12:32 PM  Result Value Ref Range   Hgb A1c MFr Bld 6.4 (H) 4.8 - 5.6 %    Comment: (NOTE) Pre diabetes:          5.7%-6.4%  Diabetes:              >6.4%  Glycemic control for   <7.0% adults with diabetes    Mean Plasma Glucose 136.98 mg/dL    Comment: Performed at [facility] [address] [place], [place] [address]  Glucose, capillary     Status: Abnormal   Collection Time: 05/26/20 12:34 PM  Result Value Ref Range   Glucose-Capillary 160 (H) 70 - 99 mg/dL    Comment: Glucose reference range applies only to samples taken after fasting for at least 8 hours.  Provider-confirm verbal Blood Bank order - RBC; 2 Units; Order taken: 05/26/2020; 12:20 AM; Emergency Release; NO units ahead     Status: None   Collection Time: 05/26/20  1:52 PM  Result Value Ref Range   Blood product order confirm      MD AUTHORIZATION REQUESTED Performed at [facility] [address], [place], [place] [address]   CBC     Status: Abnormal   Collection Time: 05/26/20  2:00 PM  Result Value Ref Range   WBC 7.7 4.0 - 10.5 K/uL   RBC 3.48 (L) 4.22 - 5.81 MIL/uL   Hemoglobin 10.5 (L) 13.0 - 17.0 g/dL   HCT 30.6 (L) 39 - 52 %   MCV 87.9 80.0 - 100.0 fL   MCH 30.2 26.0 - 34.0 pg   MCHC 34.3 30.0 - 36.0 g/dL   RDW 15.2 11.5 - 15.5 %   Platelets 149 (L) 150 -  400 K/uL   nRBC 0.0 0.0 - 0.2 %    Comment: Performed at [facility] [address] [place], [place] [address]  Provider-confirm verbal Blood Bank order - FFP; 2 Units; Order taken: 05/26/2020; 12:44 AM; Emergency Release Transfuse 2 Liquid Plasma units Emergency Released from ED Fridge     Status: None   Collection Time: 05/26/20  2:00 PM  Result Value Ref Range   Blood product  order confirm      MD AUTHORIZATION REQUESTED Performed at [facility], [place] [address] [place], [place] [address]   Provider-confirm verbal Blood Bank order - RBC; 1 Unit; Order taken: 05/26/2020; 12:43 AM; Emergency Release; NO units ahead Transfuse third unit of Group O Emergency Released RBC from ED refrigerator     Status: None   Collection Time: 05/26/20  2:01 PM  Result Value Ref Range   Blood product order confirm      MD AUTHORIZATION REQUESTED Performed at [facility], [address], [place], [place] [address]   Provider-confirm verbal Blood Bank order - RBC; 4 Units; Order taken: 05/26/2020; 12:43 AM     Status: None   Collection Time: 05/26/20  2:06 PM  Result Value Ref Range   Blood product order confirm      MD AUTHORIZATION REQUESTED Performed at [facility], [place] [address], [place], [place] [address]   Glucose, capillary     Status: Abnormal   Collection Time: 05/26/20  4:20 PM  Result Value Ref Range   Glucose-Capillary 155 (H) 70 - 99 mg/dL    Comment: Glucose reference range applies only to samples taken after fasting for at least 8 hours.  Glucose, capillary     Status: Abnormal   Collection Time: 05/26/20  8:34 PM  Result Value Ref Range   Glucose-Capillary 119 (H) 70 - 99 mg/dL    Comment: Glucose reference range applies only to samples taken after fasting for at least 8 hours.  Triglycerides     Status: Abnormal   Collection Time: 05/27/20  7:38 AM  Result Value Ref Range   Triglycerides 158 (H) <150 mg/dL    Comment: Performed at [facility] [address] [place], [place] [address]  CBC     Status: Abnormal   Collection Time: 05/27/20  7:38 AM  Result Value Ref Range   WBC 7.1 4.0 - 10.5 K/uL   RBC 3.58 (L) 4.22 - 5.81 MIL/uL   Hemoglobin 10.6 (L) 13.0 - 17.0 g/dL   HCT 31.9 (L) 39 - 52 %   MCV 89.1 80.0 - 100.0 fL   MCH 29.6 26.0 - 34.0 pg   MCHC 33.2 30.0 - 36.0 g/dL   RDW 14.9 11.5 - 15.5 %   Platelets 139 (L) 150 - 400 K/uL   nRBC 0.0 0.0 - 0.2 %    Comment: Performed at [facility], [place] [address], [place], [place] [address]  Basic metabolic panel     Status: Abnormal   Collection Time: 05/27/20  7:38 AM  Result Value Ref Range   Sodium 139 135 - 145 mmol/L   Potassium 3.5 3.5 - 5.1 mmol/L   Chloride 103 98 - 111 mmol/L   CO2 28 22 - 32 mmol/L   Glucose, Bld 135 (H) 70 - 99 mg/dL    Comment: Glucose reference range applies only to samples taken after fasting for at least 8 hours.   BUN 5 (L) 6 - 20 mg/dL   Creatinine, Ser 0.81 0.61 - 1.24 mg/dL   Calcium 8.5 (L) 8.9 - 10.3 mg/dL   GFR, Estimated >60 >60 mL/min    Comment: (NOTE) Calculated using the CKD-EPI Creatinine Equation (2021)    Anion gap 8 5 - 15    Comment: Performed at [facility] [address] [place], [place] [address]  Hepatic function panel     Status: Abnormal  Collection Time: 05/27/20  7:38 AM  Result Value Ref Range   Total Protein 5.9 (L) 6.5 - 8.1 g/dL   Albumin 3.2 (L) 3.5 - 5.0 g/dL   AST 30 15 - 41 U/L   ALT 40 0 - 44 U/L   Alkaline Phosphatase 34 (L) 38 - 126 U/L   Total Bilirubin 0.6 0.3 - 1.2 mg/dL   Bilirubin, Direct 0.2 0.0 - 0.2 mg/dL   Indirect Bilirubin 0.4 0.3 - 0.9 mg/dL    Comment: Performed at [facility] [address] [place], [place] [address]  C-reactive protein     Status: Abnormal   Collection Time: 05/27/20  7:38 AM  Result Value Ref Range   CRP 12.3 (H) <1.0 mg/dL    Comment: Performed at [facility] [address], [place], [place] [address]  Procalcitonin - Baseline      Status: None   Collection Time: 05/27/20  7:38 AM  Result Value Ref Range   Procalcitonin 0.31 ng/mL    Comment:        Interpretation: PCT (Procalcitonin) <= 0.5 ng/mL: Systemic infection (sepsis) is not likely. Local bacterial infection is possible. (NOTE)       Sepsis PCT Algorithm           Lower Respiratory Tract                                      Infection PCT Algorithm    ----------------------------     ----------------------------         PCT < 0.25 ng/mL                PCT < 0.10 ng/mL          Strongly encourage             Strongly discourage   discontinuation of antibiotics    initiation of antibiotics    ----------------------------     -----------------------------       PCT 0.25 - 0.50 ng/mL            PCT 0.10 - 0.25 ng/mL               OR       >80% decrease in PCT            Discourage initiation of                                            antibiotics      Encourage discontinuation           of antibiotics    ----------------------------     -----------------------------         PCT >= 0.50 ng/mL              PCT 0.26 - 0.50 ng/mL               AND        <80% decrease in PCT             Encourage initiation of  antibiotics       Encourage continuation           of antibiotics    ----------------------------     -----------------------------        PCT >= 0.50 ng/mL                  PCT > 0.50 ng/mL               AND         increase in PCT                  Strongly encourage                                      initiation of antibiotics    Strongly encourage escalation           of antibiotics                                     -----------------------------                                           PCT <= 0.25 ng/mL                                                 OR                                        > 80% decrease in PCT                                      Discontinue / Do not initiate                                              antibiotics  Performed at [facility], [address] [place], [place] [address]   Glucose, capillary     Status: Abnormal   Collection Time: 05/27/20  7:56 AM  Result Value Ref Range   Glucose-Capillary 131 (H) 70 - 99 mg/dL    Comment: Glucose reference range applies only to samples taken after fasting for at least 8 hours.    Medications:  Current Facility-Administered Medications  Medication Dose Route Frequency Provider Last Rate Last Admin  . 0.9 %  sodium chloride infusion  250 mL Intravenous PRN [place], Elizabeth S, PA-C      . 0.9 % NaCl with KCl 20 mEq/ L  infusion   Intravenous Continuous Georganna Skeans, MD 50 mL/hr at 05/27/20 1000 Rate Verify at 05/27/20 1000  . acetaminophen (TYLENOL) tablet 1,000 mg  1,000 mg Oral Q6H Jillyn Ledger, PA-C   1,000 mg at 05/27/20 0805  . ARIPiprazole (ABILIFY) tablet 2 mg  2 mg Oral Daily Karmen Bongo, MD  2 mg at 05/27/20 1219  . ascorbic acid (VITAMIN C) tablet 500 mg  500 mg Oral Daily Karmen Bongo, MD   500 mg at 05/27/20 1219  . chlorhexidine gluconate (MEDLINE KIT) (PERIDEX) 0.12 % solution 15 mL  15 mL Mouth Rinse BID Donnie Mesa, MD   15 mL at 05/27/20 0803  . Chlorhexidine Gluconate Cloth 2 % PADS 6 each  6 each Topical Daily Donnie Mesa, MD   6 each at 05/27/20 0200  . chlorpheniramine-HYDROcodone (TUSSIONEX) 10-8 MG/5ML suspension 5 mL  5 mL Oral Q12H PRN Karmen Bongo, MD      . docusate (COLACE) 50 MG/5ML liquid 100 mg  100 mg Oral BID Donnie Mesa, MD   100 mg at 05/27/20 1220  . enoxaparin (LOVENOX) injection 30 mg  30 mg Subcutaneous Q12H Jillyn Ledger, PA-C   30 mg at 05/27/20 1220  . FLUoxetine (PROZAC) capsule 20 mg  20 mg Oral Daily Jillyn Ledger, PA-C   20 mg at 05/27/20 1218  . guaiFENesin-dextromethorphan (ROBITUSSIN DM) 100-10 MG/5ML syrup 10 mL  10 mL Oral Q4H PRN Karmen Bongo, MD      . hydrOXYzine (ATARAX/VISTARIL) tablet 25 mg  25 mg Oral TID  Jillyn Ledger, PA-C   25 mg at 05/27/20 1218  . insulin aspart (novoLOG) injection 0-15 Units  0-15 Units Subcutaneous TID WC Georganna Skeans, MD   2 Units at 05/27/20 0758  . methylPREDNISolone sodium succinate (SOLU-MEDROL) 125 mg/2 mL injection 47.5 mg  0.5 mg/kg Intravenous Lillia Mountain, MD   47.5 mg at 05/27/20 1219   Followed by  . [START ON 05/30/2020] predniSONE (DELTASONE) tablet 50 mg  50 mg Oral Daily Karmen Bongo, MD      . metoprolol tartrate (LOPRESSOR) injection 5 mg  5 mg Intravenous Q6H PRN Donnie Mesa, MD      . morphine 2 MG/ML injection 2 mg  2 mg Intravenous Q2H PRN Maczis, Barth Kirks, PA-C      . ondansetron (ZOFRAN-ODT) disintegrating tablet 4 mg  4 mg Oral Q6H PRN Donnie Mesa, MD       Or  . ondansetron (ZOFRAN) injection 4 mg  4 mg Intravenous Q6H PRN Donnie Mesa, MD      . oxyCODONE (Oxy IR/ROXICODONE) immediate release tablet 5-10 mg  5-10 mg Oral Q4H PRN Georganna Skeans, MD      . pantoprazole (PROTONIX) injection 40 mg  40 mg Intravenous Q24H Georganna Skeans, MD   40 mg at 05/27/20 1219  . polyethylene glycol (MIRALAX / GLYCOLAX) packet 17 g  17 g Oral Daily Donnie Mesa, MD   17 g at 05/27/20 1219  . remdesivir 200 mg in sodium chloride 0.9% 250 mL IVPB  200 mg Intravenous Once Karmen Bongo, MD       Followed by  . [START ON 05/28/2020] remdesivir 100 mg in sodium chloride 0.9 % 100 mL IVPB  100 mg Intravenous Daily Karmen Bongo, MD      . sodium chloride flush (NS) 0.9 % injection 3 mL  3 mL Intravenous Q12H Jill Alexanders, PA-C   3 mL at 05/27/20 1221  . sodium chloride flush (NS) 0.9 % injection 3 mL  3 mL Intravenous PRN Jill Alexanders, PA-C      . Tdap (BOOSTRIX) injection 0.5 mL  0.5 mL Intramuscular Once Cardama, Grayce Sessions, MD      . traZODone (DESYREL) tablet 50 mg  50 mg Oral QHS Maczis, Barth Kirks, PA-C      .  zinc sulfate capsule 220 mg  220 mg Oral Daily Karmen Bongo, MD   220 mg at 05/27/20 1218     Musculoskeletal: Strength & Muscle Tone: within normal limits Gait & Station: normal Patient leans: N/A  Psychiatric Specialty Exam: Physical Exam  Review of Systems  Blood pressure 109/72, pulse 93, temperature 98.9 F (37.2 C), temperature source Oral, resp. rate (!) 22, height 6' (1.829 m), weight 95.3 kg, SpO2 (!) 87 %.Body mass index is 28.48 kg/m.  General Appearance: Disheveled  Eye Contact:  Fair  Speech:  Normal Rate  Volume:  Decreased  Mood:  Depressed and Dysphoric  Affect:  Congruent  Thought Process:  Linear and Descriptions of Associations: Intact  Orientation:  Full (Time, Place, and Person)  Thought Content:  Logical  Suicidal Thoughts:  Yes.  with intent/plan  Homicidal Thoughts:  No  Memory:  Immediate;   Good Recent;   Good  Judgement:  Intact  Insight:  Fair  Psychomotor Activity:  Normal  Concentration:  Concentration: Fair and Attention Span: Fair  Recall:  Melvin of Knowledge:  Good  Language:  Good  Akathisia:  No  Handed: Unable to assess  AIMS (if indicated):     Assets:  Communication Skills Leisure Time Physical Health Resilience Social Support  ADL's:  Intact  Cognition:  WNL  Sleep:        Treatment Plan Summary: Plan Will resume previous medications to include aripiprazole, fluoxetine, hydroxyzine, and trazodone.  Unclear as to why aripiprazole would have been discontinued, however patient will benefit from adjuvant therapy and to further target suicidal ideations.  Once patient is medically cleared, no longer in quarantine patient will benefit from inpatient psychiatric admission.  We will make recommendations to work closely with inpatient social work to facilitate psychiatric admission once he is medically stable.  Psychiatry to sign off at this time.  Disposition: Recommend psychiatric Inpatient admission when medically cleared.  This service was provided via telemedicine using a 2-way, interactive audio and video  technology.  Names of all persons participating in this telemedicine service and their role in this encounter. Name: Modena Jansky Role: Patient  Name: Burt Ek Role: Psychiatric nurse practitioner  Name: Solmon Ice Role: Staff RN    Suella Broad, [place] 05/27/2020 12:25 PM
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
## 2020-05-27 NOTE — Progress Notes (Signed)
Returning from break, unable to chart 0130 SI documentation being that I was out of room.
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
## 2020-05-27 NOTE — Care Management (Signed)
TOC Case Manager acknowledges need for IP psychiatric bed upon medical stability.   Quintella Baton, RN, BSN  Trauma/Neuro ICU Case Manager [phone]

## 2020-05-28 ENCOUNTER — Other Ambulatory Visit: Payer: Self-pay

## 2020-05-28 LAB — COMPREHENSIVE METABOLIC PANEL
ALT: 35 U/L (ref 0–44)
AST: 24 U/L (ref 15–41)
Albumin: 3.3 g/dL — ABNORMAL LOW (ref 3.5–5.0)
Alkaline Phosphatase: 37 U/L — ABNORMAL LOW (ref 38–126)
Anion gap: 8 (ref 5–15)
BUN: 5 mg/dL — ABNORMAL LOW (ref 6–20)
CO2: 28 mmol/L (ref 22–32)
Calcium: 8.8 mg/dL — ABNORMAL LOW (ref 8.9–10.3)
Chloride: 103 mmol/L (ref 98–111)
Creatinine, Ser: 0.7 mg/dL (ref 0.61–1.24)
GFR, Estimated: >60 mL/min (ref >60)
Glucose, Bld: 170 mg/dL — ABNORMAL HIGH (ref 70–99)
Potassium: 4 mmol/L (ref 3.5–5.1)
Sodium: 139 mmol/L (ref 135–145)
Total Bilirubin: 0.9 mg/dL (ref 0.3–1.2)
Total Protein: 6.6 g/dL (ref 6.5–8.1)

## 2020-05-28 LAB — CBC
HCT: 33.5 % — ABNORMAL LOW (ref 39.0–52.0)
Hemoglobin: 11.1 g/dL — ABNORMAL LOW (ref 13.0–17.0)
MCH: 29.6 pg (ref 26.0–34.0)
MCHC: 33.1 g/dL (ref 30.0–36.0)
MCV: 89.3 fL (ref 80.0–100.0)
Platelets: 133 10*3/uL — ABNORMAL LOW (ref 150–400)
RBC: 3.75 MIL/uL — ABNORMAL LOW (ref 4.22–5.81)
RDW: 14.1 % (ref 11.5–15.5)
WBC: 8 10*3/uL (ref 4.0–10.5)
nRBC: 0 % (ref 0.0–0.2)

## 2020-05-28 LAB — GLUCOSE, CAPILLARY
Glucose-Capillary: 169 mg/dL — ABNORMAL HIGH (ref 70–99)
Glucose-Capillary: 245 mg/dL — ABNORMAL HIGH (ref 70–99)
Glucose-Capillary: 193 mg/dL — ABNORMAL HIGH (ref 70–99)
Glucose-Capillary: 176 mg/dL — ABNORMAL HIGH (ref 70–99)

## 2020-05-28 LAB — C-REACTIVE PROTEIN: CRP: 14.3 mg/dL — ABNORMAL HIGH (ref <1.0)

## 2020-05-28 LAB — FERRITIN: Ferritin: 797 ng/mL — ABNORMAL HIGH (ref 24–336)

## 2020-05-28 LAB — BRAIN NATRIURETIC PEPTIDE: B Natriuretic Peptide: 79 pg/mL (ref 0.0–100.0)

## 2020-05-28 LAB — MAGNESIUM: Magnesium: 1.8 mg/dL (ref 1.7–2.4)

## 2020-05-28 LAB — LACTATE DEHYDROGENASE: LDH: 215 U/L — ABNORMAL HIGH (ref 98–192)

## 2020-05-28 LAB — PROCALCITONIN: Procalcitonin: 0.13 ng/mL

## 2020-05-28 LAB — D-DIMER, QUANTITATIVE: D-Dimer, Quant: 1.99 ug/mL-FEU — ABNORMAL HIGH (ref 0.00–0.50)

## 2020-05-28 MED ORDER — INSULIN ASPART 100 UNIT/ML ~~LOC~~ SOLN
0.0000 [IU] | Freq: Three times a day (TID) | SUBCUTANEOUS | Status: DC
  Administered 2020-05-28: 2 [IU] via SUBCUTANEOUS
  Administered 2020-05-28 – 2020-05-29 (×2): 3 [IU] via SUBCUTANEOUS
  Administered 2020-05-29 – 2020-05-30 (×4): 2 [IU] via SUBCUTANEOUS
  Administered 2020-05-30: 3 [IU] via SUBCUTANEOUS
  Administered 2020-05-31: 2 [IU] via SUBCUTANEOUS
  Administered 2020-05-31: 5 [IU] via SUBCUTANEOUS
  Administered 2020-05-31: 3 [IU] via SUBCUTANEOUS
  Administered 2020-06-01: 2 [IU] via SUBCUTANEOUS
  Administered 2020-06-01 – 2020-06-02 (×3): 1 [IU] via SUBCUTANEOUS
  Administered 2020-06-03 (×2): 2 [IU] via SUBCUTANEOUS
  Administered 2020-06-04 – 2020-06-07 (×9): 1 [IU] via SUBCUTANEOUS
  Administered 2020-06-07: 2 [IU] via SUBCUTANEOUS
  Administered 2020-06-07: 1 [IU] via SUBCUTANEOUS

## 2020-05-28 MED ORDER — INSULIN ASPART 100 UNIT/ML ~~LOC~~ SOLN: 0.0000 [IU] | Freq: Every day | SUBCUTANEOUS | Status: DC

## 2020-05-28 MED ORDER — POLYETHYLENE GLYCOL 3350 17 G PO PACK
17.0000 g | PACK | Freq: Two times a day (BID) | ORAL | Status: DC
  Administered 2020-05-28 – 2020-06-07 (×6): 17 g via ORAL
  Filled 2020-05-28 (×14): qty 1

## 2020-05-28 MED ORDER — MORPHINE SULFATE (PF) 2 MG/ML IV SOLN: 2.0000 mg | INTRAVENOUS | Status: DC | PRN

## 2020-05-28 MED ORDER — METHYLPREDNISOLONE SODIUM SUCC 125 MG IJ SOLR
60.0000 mg | Freq: Two times a day (BID) | INTRAMUSCULAR | Status: DC
  Administered 2020-05-28 (×2): 60 mg via INTRAVENOUS
  Filled 2020-05-28 (×2): qty 2
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
## 2020-05-28 NOTE — Plan of Care (Signed)
Patient is currently resting in bed. C/o mild pain, given scheduled tylenol. Drsg intact, changed this AM. VSS. Currently on 2L [place]. Sitter at bedside. No complaints overnight.   Problem: Education: Goal: Knowledge of General Education information will improve Description: Including pain rating scale, medication(s)/side effects and non-pharmacologic comfort measures Outcome: Progressing   Problem: Health Behavior/Discharge Planning: Goal: Ability to manage health-related needs will improve Outcome: Progressing   Problem: Clinical Measurements: Goal: Ability to maintain clinical measurements within normal limits will improve Outcome: Progressing Goal: Will remain free from infection Outcome: Progressing Goal: Diagnostic test results will improve Outcome: Progressing Goal: Respiratory complications will improve Outcome: Progressing Goal: Cardiovascular complication will be avoided Outcome: Progressing   Problem: Activity: Goal: Risk for activity intolerance will decrease Outcome: Progressing   Problem: Nutrition: Goal: Adequate nutrition will be maintained Outcome: Progressing   Problem: Coping: Goal: Level of anxiety will decrease Outcome: Progressing   Problem: Elimination: Goal: Will not experience complications related to bowel motility Outcome: Progressing Goal: Will not experience complications related to urinary retention Outcome: Progressing   Problem: Pain Managment: Goal: General experience of comfort will improve Outcome: Progressing   Problem: Safety: Goal: Ability to remain free from injury will improve Outcome: Progressing   Problem: Skin Integrity: Goal: Risk for impaired skin integrity will decrease Outcome: Progressing   Problem: Education: Goal: Knowledge of risk factors and measures for prevention of condition will improve Outcome: Progressing   Problem: Coping: Goal: Psychosocial and spiritual needs will be supported Outcome: Progressing    Problem: Respiratory: Goal: Will maintain a patent airway Outcome: Progressing Goal: Complications related to the disease process, condition or treatment will be avoided or minimized Outcome: Progressing

## 2020-05-28 NOTE — TOC CAGE-AID Note (Signed)
Transition of Care [facility]) - CAGE-AID Screening   Patient Details  Name: George Hobbs MRN: [id number] Date of Birth: [birth year]  [facility] CM/SW Contact:    Jimmy Picket, [place] Phone Number: 05/28/2020, 4:30 PM   Clinical Narrative:  Pt did not answer room phone.  CAGE-AID Screening: Substance Abuse Screening unable to be completed due to: : Patient unable to participate               Isabella Stalling Clinical Social Worker [phone]
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
## 2020-05-29 ENCOUNTER — Inpatient Hospital Stay (HOSPITAL_COMMUNITY): Payer: BC Managed Care – PPO

## 2020-05-29 LAB — CBC
HCT: 31.1 % — ABNORMAL LOW (ref 39.0–52.0)
Hemoglobin: 10.5 g/dL — ABNORMAL LOW (ref 13.0–17.0)
MCH: 30.1 pg (ref 26.0–34.0)
MCHC: 33.8 g/dL (ref 30.0–36.0)
MCV: 89.1 fL (ref 80.0–100.0)
Platelets: 169 10*3/uL (ref 150–400)
RBC: 3.49 MIL/uL — ABNORMAL LOW (ref 4.22–5.81)
RDW: 14 % (ref 11.5–15.5)
WBC: 10.3 10*3/uL (ref 4.0–10.5)
nRBC: 0 % (ref 0.0–0.2)

## 2020-05-29 LAB — COMPREHENSIVE METABOLIC PANEL
ALT: 29 U/L (ref 0–44)
AST: 20 U/L (ref 15–41)
Albumin: 3 g/dL — ABNORMAL LOW (ref 3.5–5.0)
Alkaline Phosphatase: 34 U/L — ABNORMAL LOW (ref 38–126)
Anion gap: 9 (ref 5–15)
BUN: 8 mg/dL (ref 6–20)
CO2: 27 mmol/L (ref 22–32)
Calcium: 8.6 mg/dL — ABNORMAL LOW (ref 8.9–10.3)
Chloride: 104 mmol/L (ref 98–111)
Creatinine, Ser: 0.83 mg/dL (ref 0.61–1.24)
GFR, Estimated: >60 mL/min (ref >60)
Glucose, Bld: 196 mg/dL — ABNORMAL HIGH (ref 70–99)
Potassium: 3.8 mmol/L (ref 3.5–5.1)
Sodium: 140 mmol/L (ref 135–145)
Total Bilirubin: 0.9 mg/dL (ref 0.3–1.2)
Total Protein: 5.8 g/dL — ABNORMAL LOW (ref 6.5–8.1)

## 2020-05-29 LAB — MAGNESIUM: Magnesium: 1.6 mg/dL — ABNORMAL LOW (ref 1.7–2.4)

## 2020-05-29 LAB — GLUCOSE, CAPILLARY
Glucose-Capillary: 174 mg/dL — ABNORMAL HIGH (ref 70–99)
Glucose-Capillary: 185 mg/dL — ABNORMAL HIGH (ref 70–99)
Glucose-Capillary: 245 mg/dL — ABNORMAL HIGH (ref 70–99)
Glucose-Capillary: 185 mg/dL — ABNORMAL HIGH (ref 70–99)

## 2020-05-29 LAB — BRAIN NATRIURETIC PEPTIDE: B Natriuretic Peptide: 56.7 pg/mL (ref 0.0–100.0)

## 2020-05-29 LAB — D-DIMER, QUANTITATIVE: D-Dimer, Quant: 0.93 ug/mL-FEU — ABNORMAL HIGH (ref 0.00–0.50)

## 2020-05-29 LAB — C-REACTIVE PROTEIN: CRP: 4.9 mg/dL — ABNORMAL HIGH (ref <1.0)

## 2020-05-29 LAB — PROCALCITONIN: Procalcitonin: <0.1 ng/mL

## 2020-05-29 MED ORDER — DM-GUAIFENESIN ER 30-600 MG PO TB12
1.0000 | ORAL_TABLET | Freq: Two times a day (BID) | ORAL | Status: DC | PRN
  Administered 2020-06-02: 1 via ORAL
  Filled 2020-05-29: qty 1

## 2020-05-29 MED ORDER — MAGNESIUM SULFATE 2 GM/50ML IV SOLN
2.0000 g | Freq: Once | INTRAVENOUS | Status: AC
  Administered 2020-05-29: 2 g via INTRAVENOUS
  Filled 2020-05-29: qty 50

## 2020-05-29 MED ORDER — OXYCODONE HCL 5 MG PO TABS: 5.0000 mg | ORAL_TABLET | ORAL | Status: DC | PRN

## 2020-05-29 MED ORDER — METHYLPREDNISOLONE SODIUM SUCC 40 MG IJ SOLR
40.0000 mg | Freq: Two times a day (BID) | INTRAMUSCULAR | Status: AC
  Administered 2020-05-29 – 2020-05-31 (×6): 40 mg via INTRAVENOUS
  Filled 2020-05-29 (×6): qty 1

## 2020-05-29 MED ORDER — MAGNESIUM SULFATE 2 GM/50ML IV SOLN
2.0000 g | Freq: Once | INTRAVENOUS | Status: DC
  Filled 2020-05-29 (×2): qty 50
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
## 2020-05-30 LAB — TYPE AND SCREEN
ABO/RH(D): B POS
Antibody Screen: NEGATIVE
Unit division: 0
Unit division: 0
Unit division: 0
Unit division: 0
Unit division: 0
Unit division: 0
Unit division: 0
Unit division: 0
Unit division: 0
Unit division: 0
Unit division: 0
Unit division: 0
Unit division: 0
Unit division: 0
Unit division: 0
Unit division: 0
Unit division: 0
Unit division: 0
Unit division: 0
Unit division: 0
Unit division: 0
Unit division: 0
Unit division: 0

## 2020-05-30 LAB — BPAM RBC
Blood Product Expiration Date: 202111062359
Blood Product Expiration Date: 202111272359
Blood Product Expiration Date: 202111302359
Blood Product Expiration Date: 202111302359
Blood Product Expiration Date: 202112042359
Blood Product Expiration Date: 202112042359
Blood Product Expiration Date: 202112042359
Blood Product Expiration Date: 202112042359
Blood Product Expiration Date: 202112042359
Blood Product Expiration Date: 202112042359
Blood Product Expiration Date: 202112042359
Blood Product Expiration Date: 202112042359
Blood Product Expiration Date: 202112042359
Blood Product Expiration Date: 202112042359
Blood Product Expiration Date: 202112042359
Blood Product Expiration Date: 202112052359
Blood Product Expiration Date: 202112052359
Blood Product Expiration Date: 202112052359
Blood Product Expiration Date: 202112052359
Blood Product Expiration Date: 202112062359
Blood Product Expiration Date: 202112062359
Blood Product Expiration Date: 202112062359
Blood Product Expiration Date: 202112062359
ISSUE DATE / TIME: 202111040020
ISSUE DATE / TIME: 202111040020
ISSUE DATE / TIME: 202111040043
ISSUE DATE / TIME: 202111040045
ISSUE DATE / TIME: 202111040045
ISSUE DATE / TIME: 202111040048
ISSUE DATE / TIME: 202111040048
ISSUE DATE / TIME: 202111040102
ISSUE DATE / TIME: 202111040106
ISSUE DATE / TIME: 202111040106
ISSUE DATE / TIME: 202111040111
ISSUE DATE / TIME: 202111040111
ISSUE DATE / TIME: 202111040111
ISSUE DATE / TIME: 202111040111
ISSUE DATE / TIME: 202111040116
ISSUE DATE / TIME: 202111040116
ISSUE DATE / TIME: 202111040116
ISSUE DATE / TIME: 202111040838
ISSUE DATE / TIME: 202111041238
ISSUE DATE / TIME: 202111041343
ISSUE DATE / TIME: 202111041448
ISSUE DATE / TIME: 202111061017
ISSUE DATE / TIME: 202111080740
Unit Type and Rh: 5100
Unit Type and Rh: 5100
Unit Type and Rh: 5100
Unit Type and Rh: 5100
Unit Type and Rh: 5100
Unit Type and Rh: 5100
Unit Type and Rh: 5100
Unit Type and Rh: 5100
Unit Type and Rh: 5100
Unit Type and Rh: 5100
Unit Type and Rh: 5100
Unit Type and Rh: 7300
Unit Type and Rh: 7300
Unit Type and Rh: 7300
Unit Type and Rh: 7300
Unit Type and Rh: 7300
Unit Type and Rh: 7300
Unit Type and Rh: 7300
Unit Type and Rh: 7300
Unit Type and Rh: 7300
Unit Type and Rh: 7300
Unit Type and Rh: 7300
Unit Type and Rh: 7300

## 2020-05-30 LAB — GLUCOSE, CAPILLARY
Glucose-Capillary: 153 mg/dL — ABNORMAL HIGH (ref 70–99)
Glucose-Capillary: 200 mg/dL — ABNORMAL HIGH (ref 70–99)
Glucose-Capillary: 228 mg/dL — ABNORMAL HIGH (ref 70–99)
Glucose-Capillary: 175 mg/dL — ABNORMAL HIGH (ref 70–99)

## 2020-05-30 LAB — COMPREHENSIVE METABOLIC PANEL
ALT: 31 U/L (ref 0–44)
AST: 20 U/L (ref 15–41)
Albumin: 3.1 g/dL — ABNORMAL LOW (ref 3.5–5.0)
Alkaline Phosphatase: 30 U/L — ABNORMAL LOW (ref 38–126)
Anion gap: 10 (ref 5–15)
BUN: 10 mg/dL (ref 6–20)
CO2: 26 mmol/L (ref 22–32)
Calcium: 8.4 mg/dL — ABNORMAL LOW (ref 8.9–10.3)
Chloride: 103 mmol/L (ref 98–111)
Creatinine, Ser: 0.73 mg/dL (ref 0.61–1.24)
GFR, Estimated: >60 mL/min (ref >60)
Glucose, Bld: 195 mg/dL — ABNORMAL HIGH (ref 70–99)
Potassium: 4.1 mmol/L (ref 3.5–5.1)
Sodium: 139 mmol/L (ref 135–145)
Total Bilirubin: 0.8 mg/dL (ref 0.3–1.2)
Total Protein: 6.2 g/dL — ABNORMAL LOW (ref 6.5–8.1)

## 2020-05-30 LAB — MAGNESIUM: Magnesium: 2.3 mg/dL (ref 1.7–2.4)

## 2020-05-30 LAB — PROCALCITONIN: Procalcitonin: <0.1 ng/mL

## 2020-05-30 LAB — BRAIN NATRIURETIC PEPTIDE: B Natriuretic Peptide: 57.7 pg/mL (ref 0.0–100.0)

## 2020-05-30 LAB — C-REACTIVE PROTEIN: CRP: 1.9 mg/dL — ABNORMAL HIGH (ref <1.0)

## 2020-05-30 LAB — D-DIMER, QUANTITATIVE: D-Dimer, Quant: 0.44 ug/mL-FEU (ref 0.00–0.50)

## 2020-05-30 MED ORDER — PHENOL 1.4 % MT LIQD
1.0000 | OROMUCOSAL | Status: DC | PRN
  Administered 2020-06-04: 1 via OROMUCOSAL
  Filled 2020-05-30: qty 177

## 2020-05-30 NOTE — Plan of Care (Signed)
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
## 2020-05-30 NOTE — Progress Notes (Signed)
   ENT Progress Note: POD #4 s/p Procedure(s): NECK WOUND EXPLORATION   Subjective: Patient reports improved discomfort and swelling  Objective: Vital signs in last 24 hours: Temp:  [97.9 F (36.6 C)-98.4 F (36.9 C)] 98.3 F (36.8 C) (11/08 1207) Pulse Rate:  [57-77] 64 (11/08 1207) Resp:  [15-22] 20 (11/08 1207) BP: (126-148)/(78-97) 134/91 (11/08 1207) SpO2:  [93 %-96 %] 94 % (11/08 1207) Weight change:  Last BM Date: 05/26/20  Intake/Output from previous day: 11/07 0701 - 11/08 0700 In: 490 [P.O.:490] Out: 1130 [Urine:1130] Intake/Output this shift: Total I/O In: 240 [P.O.:240] Out: -   Labs: Recent Labs    05/28/20 0346 05/29/20 0314  WBC 8.0 10.3  HGB 11.1* 10.5*  HCT 33.5* 31.1*  PLT 133* 169   Recent Labs    05/29/20 0314 05/30/20 0347  NA 140 139  K 3.8 4.1  CL 104 103  CO2 27 26  GLUCOSE 196* 195*  BUN 8 10  CALCIUM 8.6* 8.4*    Studies/Results: DG Chest Port 1 View  Result Date: 05/29/2020 CLINICAL DATA:  Dyspnea EXAM: PORTABLE CHEST 1 VIEW COMPARISON:  05/27/2020 chest radiograph. FINDINGS: Stable cardiomediastinal silhouette with normal heart size. No pneumothorax. No pleural effusion. Improved lung volumes. Clear right lung. Mild left lung base atelectasis, decreased. No pulmonary edema. IMPRESSION: Improved lung volumes with decreased mild left lung base atelectasis. Electronically Signed   By: Delbert Phenix M.D.   On: 05/29/2020 14:50     PHYSICAL EXAM: Right neck incision appears intact with sutures.  No significant erythema or additional swelling.  Normal tongue and palatal mobility.  Minimal weakness of the right marginal mandibular.   Assessment/Plan: Patient stable, recommend continued wound care precautions and dressing.  Oral antibiotics as prescribed.  Advance diet to soft as tolerated.  Monitor for additional symptoms of infection and plan follow-up with me at [facility] in approximately 2 weeks for recheck and suture  removal.    Osborn Coho 05/30/2020, 12:19 PM
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
## 2020-05-31 LAB — COMPREHENSIVE METABOLIC PANEL
ALT: 37 U/L (ref 0–44)
AST: 24 U/L (ref 15–41)
Albumin: 3.1 g/dL — ABNORMAL LOW (ref 3.5–5.0)
Alkaline Phosphatase: 38 U/L (ref 38–126)
Anion gap: 7 (ref 5–15)
BUN: 14 mg/dL (ref 6–20)
CO2: 26 mmol/L (ref 22–32)
Calcium: 8.3 mg/dL — ABNORMAL LOW (ref 8.9–10.3)
Chloride: 104 mmol/L (ref 98–111)
Creatinine, Ser: 0.71 mg/dL (ref 0.61–1.24)
GFR, Estimated: >60 mL/min (ref >60)
Glucose, Bld: 231 mg/dL — ABNORMAL HIGH (ref 70–99)
Potassium: 3.8 mmol/L (ref 3.5–5.1)
Sodium: 137 mmol/L (ref 135–145)
Total Bilirubin: 1 mg/dL (ref 0.3–1.2)
Total Protein: 6.3 g/dL — ABNORMAL LOW (ref 6.5–8.1)

## 2020-05-31 LAB — CBC WITH DIFFERENTIAL/PLATELET
Abs Immature Granulocytes: 0.11 10*3/uL — ABNORMAL HIGH (ref 0.00–0.07)
Basophils Absolute: 0 10*3/uL (ref 0.0–0.1)
Basophils Relative: 0 %
Eosinophils Absolute: 0 10*3/uL (ref 0.0–0.5)
Eosinophils Relative: 0 %
HCT: 32.8 % — ABNORMAL LOW (ref 39.0–52.0)
Hemoglobin: 10.8 g/dL — ABNORMAL LOW (ref 13.0–17.0)
Immature Granulocytes: 1 %
Lymphocytes Relative: 15 %
Lymphs Abs: 1.4 10*3/uL (ref 0.7–4.0)
MCH: 29.4 pg (ref 26.0–34.0)
MCHC: 32.9 g/dL (ref 30.0–36.0)
MCV: 89.4 fL (ref 80.0–100.0)
Monocytes Absolute: 0.6 10*3/uL (ref 0.1–1.0)
Monocytes Relative: 7 %
Neutro Abs: 6.9 10*3/uL (ref 1.7–7.7)
Neutrophils Relative %: 77 %
Platelets: 228 10*3/uL (ref 150–400)
RBC: 3.67 MIL/uL — ABNORMAL LOW (ref 4.22–5.81)
RDW: 13.8 % (ref 11.5–15.5)
WBC: 9 10*3/uL (ref 4.0–10.5)
nRBC: 0 % (ref 0.0–0.2)

## 2020-05-31 LAB — MAGNESIUM: Magnesium: 2.1 mg/dL (ref 1.7–2.4)

## 2020-05-31 LAB — GLUCOSE, CAPILLARY
Glucose-Capillary: 169 mg/dL — ABNORMAL HIGH (ref 70–99)
Glucose-Capillary: 207 mg/dL — ABNORMAL HIGH (ref 70–99)
Glucose-Capillary: 251 mg/dL — ABNORMAL HIGH (ref 70–99)
Glucose-Capillary: 185 mg/dL — ABNORMAL HIGH (ref 70–99)

## 2020-05-31 LAB — BRAIN NATRIURETIC PEPTIDE: B Natriuretic Peptide: 68.3 pg/mL (ref 0.0–100.0)

## 2020-05-31 LAB — C-REACTIVE PROTEIN: CRP: 1 mg/dL — ABNORMAL HIGH (ref <1.0)

## 2020-05-31 LAB — D-DIMER, QUANTITATIVE: D-Dimer, Quant: 0.64 ug/mL-FEU — ABNORMAL HIGH (ref 0.00–0.50)

## 2020-05-31 LAB — PROCALCITONIN: Procalcitonin: <0.1 ng/mL
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
## 2020-05-31 NOTE — Plan of Care (Signed)

## 2020-06-01 LAB — COMPREHENSIVE METABOLIC PANEL
ALT: 37 U/L (ref 0–44)
AST: 15 U/L (ref 15–41)
Albumin: 3.1 g/dL — ABNORMAL LOW (ref 3.5–5.0)
Alkaline Phosphatase: 40 U/L (ref 38–126)
Anion gap: 10 (ref 5–15)
BUN: 11 mg/dL (ref 6–20)
CO2: 24 mmol/L (ref 22–32)
Calcium: 8.4 mg/dL — ABNORMAL LOW (ref 8.9–10.3)
Chloride: 103 mmol/L (ref 98–111)
Creatinine, Ser: 0.68 mg/dL (ref 0.61–1.24)
GFR, Estimated: >60 mL/min (ref >60)
Glucose, Bld: 222 mg/dL — ABNORMAL HIGH (ref 70–99)
Potassium: 3.4 mmol/L — ABNORMAL LOW (ref 3.5–5.1)
Sodium: 137 mmol/L (ref 135–145)
Total Bilirubin: 1.1 mg/dL (ref 0.3–1.2)
Total Protein: 6.3 g/dL — ABNORMAL LOW (ref 6.5–8.1)

## 2020-06-01 LAB — CBC WITH DIFFERENTIAL/PLATELET
Abs Immature Granulocytes: 0.11 10*3/uL — ABNORMAL HIGH (ref 0.00–0.07)
Basophils Absolute: 0 10*3/uL (ref 0.0–0.1)
Basophils Relative: 0 %
Eosinophils Absolute: 0 10*3/uL (ref 0.0–0.5)
Eosinophils Relative: 0 %
HCT: 32.5 % — ABNORMAL LOW (ref 39.0–52.0)
Hemoglobin: 10.9 g/dL — ABNORMAL LOW (ref 13.0–17.0)
Immature Granulocytes: 1 %
Lymphocytes Relative: 8 %
Lymphs Abs: 1 10*3/uL (ref 0.7–4.0)
MCH: 30.3 pg (ref 26.0–34.0)
MCHC: 33.5 g/dL (ref 30.0–36.0)
MCV: 90.3 fL (ref 80.0–100.0)
Monocytes Absolute: 0.7 10*3/uL (ref 0.1–1.0)
Monocytes Relative: 6 %
Neutro Abs: 10.1 10*3/uL — ABNORMAL HIGH (ref 1.7–7.7)
Neutrophils Relative %: 85 %
Platelets: 251 10*3/uL (ref 150–400)
RBC: 3.6 MIL/uL — ABNORMAL LOW (ref 4.22–5.81)
RDW: 13.9 % (ref 11.5–15.5)
WBC: 11.9 10*3/uL — ABNORMAL HIGH (ref 4.0–10.5)
nRBC: 0 % (ref 0.0–0.2)

## 2020-06-01 LAB — GLUCOSE, CAPILLARY
Glucose-Capillary: 177 mg/dL — ABNORMAL HIGH (ref 70–99)
Glucose-Capillary: 134 mg/dL — ABNORMAL HIGH (ref 70–99)
Glucose-Capillary: 126 mg/dL — ABNORMAL HIGH (ref 70–99)
Glucose-Capillary: 187 mg/dL — ABNORMAL HIGH (ref 70–99)

## 2020-06-01 MED ORDER — METFORMIN HCL 500 MG PO TABS
500.0000 mg | ORAL_TABLET | Freq: Two times a day (BID) | ORAL | Status: DC
  Administered 2020-06-01 – 2020-06-07 (×14): 500 mg via ORAL
  Filled 2020-06-01 (×14): qty 1

## 2020-06-01 MED ORDER — POTASSIUM CHLORIDE CRYS ER 20 MEQ PO TBCR
40.0000 meq | EXTENDED_RELEASE_TABLET | Freq: Once | ORAL | Status: AC
  Administered 2020-06-01: 40 meq via ORAL
  Filled 2020-06-01: qty 2
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
## 2020-06-01 NOTE — Plan of Care (Signed)
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
## 2020-06-02 LAB — CBC WITH DIFFERENTIAL/PLATELET
Abs Immature Granulocytes: 0.09 10*3/uL — ABNORMAL HIGH (ref 0.00–0.07)
Basophils Absolute: 0 10*3/uL (ref 0.0–0.1)
Basophils Relative: 0 %
Eosinophils Absolute: 0.2 10*3/uL (ref 0.0–0.5)
Eosinophils Relative: 2 %
HCT: 33.6 % — ABNORMAL LOW (ref 39.0–52.0)
Hemoglobin: 11.3 g/dL — ABNORMAL LOW (ref 13.0–17.0)
Immature Granulocytes: 1 %
Lymphocytes Relative: 30 %
Lymphs Abs: 2.3 10*3/uL (ref 0.7–4.0)
MCH: 30.1 pg (ref 26.0–34.0)
MCHC: 33.6 g/dL (ref 30.0–36.0)
MCV: 89.6 fL (ref 80.0–100.0)
Monocytes Absolute: 0.8 10*3/uL (ref 0.1–1.0)
Monocytes Relative: 11 %
Neutro Abs: 4.3 10*3/uL (ref 1.7–7.7)
Neutrophils Relative %: 56 %
Platelets: 263 10*3/uL (ref 150–400)
RBC: 3.75 MIL/uL — ABNORMAL LOW (ref 4.22–5.81)
RDW: 14.4 % (ref 11.5–15.5)
WBC: 7.6 10*3/uL (ref 4.0–10.5)
nRBC: 0 % (ref 0.0–0.2)

## 2020-06-02 LAB — GLUCOSE, CAPILLARY
Glucose-Capillary: 118 mg/dL — ABNORMAL HIGH (ref 70–99)
Glucose-Capillary: 133 mg/dL — ABNORMAL HIGH (ref 70–99)
Glucose-Capillary: 129 mg/dL — ABNORMAL HIGH (ref 70–99)
Glucose-Capillary: 162 mg/dL — ABNORMAL HIGH (ref 70–99)

## 2020-06-02 LAB — BASIC METABOLIC PANEL
Anion gap: 8 (ref 5–15)
BUN: 12 mg/dL (ref 6–20)
CO2: 25 mmol/L (ref 22–32)
Calcium: 8.1 mg/dL — ABNORMAL LOW (ref 8.9–10.3)
Chloride: 104 mmol/L (ref 98–111)
Creatinine, Ser: 0.85 mg/dL (ref 0.61–1.24)
GFR, Estimated: >60 mL/min (ref >60)
Glucose, Bld: 134 mg/dL — ABNORMAL HIGH (ref 70–99)
Potassium: 3.6 mmol/L (ref 3.5–5.1)
Sodium: 137 mmol/L (ref 135–145)

## 2020-06-02 LAB — MAGNESIUM: Magnesium: 1.8 mg/dL (ref 1.7–2.4)

## 2020-06-02 MED ORDER — OXYCODONE HCL 5 MG PO TABS: 5.0000 mg | ORAL_TABLET | ORAL | Status: DC | PRN
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
## 2020-06-02 NOTE — Progress Notes (Signed)
7 Days Post-Op  Subjective: Patient with some c/o neck soreness today, but overall doing ok. Doesn't have much appetite.  Moving his bowels.  Denies SOB  ROS: See above, otherwise other systems negative  Objective: Vital signs in last 24 hours: Temp:  [98 F (36.7 C)-98.9 F (37.2 C)] 98.5 F (36.9 C) (11/11 1137) Pulse Rate:  [56-75] 75 (11/11 1137) Resp:  [18-22] 18 (11/11 1137) BP: (130-143)/(83-100) 142/98 (11/11 1137) SpO2:  [94 %-97 %] 97 % (11/11 1137) Last BM Date: 05/30/20  Intake/Output from previous day: 11/10 0701 - 11/11 0700 In: 360 [P.O.:360] Out: 1175 [Urine:1175] Intake/Output this shift: Total I/O In: 120 [P.O.:120] Out: 800 [Urine:800]  PE: General: pleasant, NAD HEENT: neck slightly edematous on the right but no cellulitis or drainage around incision Heart: regular, rate, and rhythm. Lungs: CTAB Abd: soft, NT, ND, +BS MS: MAE Psych: A&Ox3 with an appropriate affect.  Lab Results:  Recent Labs    06/01/20 0024 06/02/20 0130  WBC 11.9* 7.6  HGB 10.9* 11.3*  HCT 32.5* 33.6*  PLT 251 263   BMET Recent Labs    06/01/20 0024 06/02/20 0746  NA 137 137  K 3.4* 3.6  CL 103 104  CO2 24 25  GLUCOSE 222* 134*  BUN 11 12  CREATININE 0.68 0.85  CALCIUM 8.4* 8.1*   PT/INR No results for input(s): LABPROT, INR in the last 72 hours. CMP     Component Value Date/Time   NA 137 06/02/2020 0746   K 3.6 06/02/2020 0746   CL 104 06/02/2020 0746   CO2 25 06/02/2020 0746   GLUCOSE 134 (H) 06/02/2020 0746   BUN 12 06/02/2020 0746   CREATININE 0.85 06/02/2020 0746   CALCIUM 8.1 (L) 06/02/2020 0746   PROT 6.3 (L) 06/01/2020 0024   ALBUMIN 3.1 (L) 06/01/2020 0024   AST 15 06/01/2020 0024   ALT 37 06/01/2020 0024   ALKPHOS 40 06/01/2020 0024   BILITOT 1.1 06/01/2020 0024   GFRNONAA >60 06/02/2020 0746   Lipase  No results found for: LIPASE     Studies/Results: No results found.  Anti-infectives: Anti-infectives (From admission,  onward)   Start     Dose/Rate Route Frequency Ordered Stop   05/28/20 1000  remdesivir 100 mg in sodium chloride 0.9 % 100 mL IVPB       "Followed by" Linked Group Details   100 mg 200 mL/hr over 30 Minutes Intravenous Daily 05/27/20 1114 05/31/20 1926   05/27/20 1200  remdesivir 200 mg in sodium chloride 0.9% 250 mL IVPB       "Followed by" Linked Group Details   200 mg 580 mL/hr over 30 Minutes Intravenous Once 05/27/20 1114 05/27/20 1318   05/26/20 0600  ceFAZolin (ANCEF) IVPB 1 g/50 mL premix  Status:  Discontinued        1 g 100 mL/hr over 30 Minutes Intravenous Every 8 hours 05/26/20 0224 05/27/20 0943       Assessment/Plan SI SW R neck S/P R neck exploration with ligation of injuries to external jugular vein and R lingual artery by Dr. Elijah Birk, Dr. Arbie Cookey, and Dr. Annia Friendly 11/4 - wound clean, penrose drainremoved 11/5. Per ENTno evidence of leaking fluid from his neck to suggest a fistula although it is early. - per ENT ok advance diet as tolerated but continue to monitor for signs of leak Acute hypoxic ventilator dependent respiratory failure-Extubated 11/4.Off supplemental oxygen.  COVID+-appreciate TRH consult, completed course of steroids and Remdesivir; will need 10  days of isolation/contact precautions before cleared for D/C to [facility] (11/13) SI-seen by psych 11/5 and rec inpatient psych at discharge.suicide precautions, home meds. Recently d/c'd from [facility] on 10/6. ABL anemia-Hgb10.8 from10.5, stable DM2 - SSI. Home metformin on hold for now. A1c 6.4  FEN-soft diet.Continue colace andmiralax BID VTE- PAS, lovenox ID - Ancef11/4>11/5. remdesivir 11/6-11/9 Foley - D/c'd. Voiding  Dispo-Psych placement on discharge, COVID quarantine will be complete 11/13   LOS: 7 days    Letha Cape , [facility] Surgery 06/02/2020, 12:21 PM Please see Amion for pager number during day hours 7:00am-4:30pm or 7:00am -11:30am on weekends

## 2020-06-03 LAB — GLUCOSE, CAPILLARY
Glucose-Capillary: 132 mg/dL — ABNORMAL HIGH (ref 70–99)
Glucose-Capillary: 165 mg/dL — ABNORMAL HIGH (ref 70–99)
Glucose-Capillary: 177 mg/dL — ABNORMAL HIGH (ref 70–99)
Glucose-Capillary: 103 mg/dL — ABNORMAL HIGH (ref 70–99)
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
## 2020-06-04 LAB — GLUCOSE, CAPILLARY
Glucose-Capillary: 126 mg/dL — ABNORMAL HIGH (ref 70–99)
Glucose-Capillary: 132 mg/dL — ABNORMAL HIGH (ref 70–99)
Glucose-Capillary: 109 mg/dL — ABNORMAL HIGH (ref 70–99)
Glucose-Capillary: 158 mg/dL — ABNORMAL HIGH (ref 70–99)
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
## 2020-06-05 LAB — GLUCOSE, CAPILLARY
Glucose-Capillary: 146 mg/dL — ABNORMAL HIGH (ref 70–99)
Glucose-Capillary: 142 mg/dL — ABNORMAL HIGH (ref 70–99)
Glucose-Capillary: 141 mg/dL — ABNORMAL HIGH (ref 70–99)
Glucose-Capillary: 136 mg/dL — ABNORMAL HIGH (ref 70–99)

## 2020-06-05 MED ORDER — ORAL CARE MOUTH RINSE
15.0000 mL | Freq: Two times a day (BID) | OROMUCOSAL | Status: DC
  Administered 2020-06-05 – 2020-06-07 (×5): 15 mL via OROMUCOSAL

## 2020-06-05 NOTE — TOC Progression Note (Addendum)
Transition of Care [facility]) - Progression Note    Patient Details  Name: George Hobbs MRN: [id number] Date of Birth: 1981/01/09  [facility] CM/SW Contact  Verna Czech [place], [place] Phone Number: [phone] 06/05/2020, 10:28 AM  Clinical Narrative:    Patient now out of quarantine and able to transfer to a behavioral health unit. Updated Psychiatric evaluation requested, however per attending not necessary as patient previously evaluated X2.  Phone call to [facility] to inquire about bed placement. Spoke with Jessie Foot who has reviewed patient with the A/C-no response regarding bed availability yet. This Child psychotherapist faxed out to surrounding  facilities as well.   [place], [place] Transition of Care [phone]    Expected Discharge Plan: Psychiatric Hospital Barriers to Discharge: Continued Medical Work up  Expected Discharge Plan and Services Expected Discharge Plan: Psychiatric Hospital In-house Referral: Clinical Social Work Discharge Planning Services: CM Consult   Living arrangements for the past 2 months: Single Family Home                                       Social Determinants of Health (SDOH) Interventions    Readmission Risk Interventions No flowsheet data found.

## 2020-06-05 NOTE — Progress Notes (Signed)
Patient pleasant and cooperative. Patient denies suicidal ideations. Patient states that he is just anxious to get out of the hospital and start treatment at Behavior Health. Patient worried that he will just be prescribed medicine and released too soon without improvement like what has happened previously. Patient anxious to see family.  Patient mentioned left arm being swollen and tender. Patient previously had an IV to left antecubital space. IV was previously removed, but there is a small wound/ scab at old insertion site. There is swelling noted distal and proximal to old insertion site, and redness noted to forearm. Medial upper arm and anterior forearm noted to be warm to touch. Patient cannot recall if the MD has seen this. This RN will report this finding in morning, and will continue to monitor.
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
## 2020-06-06 ENCOUNTER — Inpatient Hospital Stay (HOSPITAL_COMMUNITY): Payer: BC Managed Care – PPO

## 2020-06-06 DIAGNOSIS — M79609 Pain in unspecified limb: Secondary | ICD-10-CM

## 2020-06-06 DIAGNOSIS — M7989 Other specified soft tissue disorders: Secondary | ICD-10-CM

## 2020-06-06 LAB — GLUCOSE, CAPILLARY
Glucose-Capillary: 143 mg/dL — ABNORMAL HIGH (ref 70–99)
Glucose-Capillary: 142 mg/dL — ABNORMAL HIGH (ref 70–99)
Glucose-Capillary: 126 mg/dL — ABNORMAL HIGH (ref 70–99)
Glucose-Capillary: 116 mg/dL — ABNORMAL HIGH (ref 70–99)

## 2020-06-06 MED ORDER — ASPIRIN 81 MG PO CHEW
81.0000 mg | CHEWABLE_TABLET | Freq: Every day | ORAL | Status: DC
  Administered 2020-06-06 – 2020-06-07 (×2): 81 mg via ORAL
  Filled 2020-06-06 (×2): qty 1

## 2020-06-06 MED ORDER — IBUPROFEN 600 MG PO TABS: 600.0000 mg | ORAL_TABLET | Freq: Four times a day (QID) | ORAL | Status: DC | PRN

## 2020-06-06 NOTE — TOC Progression Note (Signed)
Transition of Care [facility]) - Progression Note    Patient Details  Name: George Hobbs MRN: [id number] Date of Birth: [birth year]  Transition of Care [facility]) CM/SW Contact  Glennon Mac, RN Phone Number: 06/06/2020, [phone]  Clinical Narrative:   Requested update from [facility] and [facility] on bed availability.  Addendum:  1350 Notified by bedside RN that pt requests to talk to Case Mgr re: plans for IP psych; spoke with pt by phone.  He states that he is no longer suicidal, and can secure OP services upon discharge.  He feels he no longer needs IP psych admission.  Will discuss with Trauma team.    Addendum: Discussed patient at Trauma Rounds; we will have psych MD reevaluate patient for continued need for IP psych admission.  In the meantime, we will issue IVC paperwork.  5pm:  [place] Non-emergent police notified of need for IVC service.  IVC forms given to Warehouse manager to put in chart.      Expected Discharge Plan: Psychiatric Hospital Barriers to Discharge: Continued Medical Work up  Expected Discharge Plan and Services Expected Discharge Plan: Psychiatric Hospital In-house Referral: Clinical Social Work Discharge Planning Services: CM Consult   Living arrangements for the past 2 months: Single Family Home                                       Social Determinants of Health (SDOH) Interventions    Readmission Risk Interventions No flowsheet data found.  Quintella Baton, RN, BSN  Trauma/Neuro ICU Case Manager [phone]
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
## 2020-06-06 NOTE — Progress Notes (Signed)
Upper extremity venous has been completed.   Preliminary results in CV Proc.   Blanch Media 06/06/2020 11:53 AM
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
## 2020-06-07 ENCOUNTER — Inpatient Hospital Stay (HOSPITAL_COMMUNITY)
Admission: AD | Admit: 2020-06-07 | Discharge: 2020-06-11 | DRG: 885 | Disposition: A | Discharge status: Home / Self Care | Payer: BC Managed Care – PPO | Source: Intra-hospital | Attending: Psychiatry | Admitting: Psychiatry

## 2020-06-07 ENCOUNTER — Other Ambulatory Visit: Payer: Self-pay

## 2020-06-07 DIAGNOSIS — D500 Iron deficiency anemia secondary to blood loss (chronic): Secondary | ICD-10-CM | POA: Diagnosis present

## 2020-06-07 DIAGNOSIS — F333 Major depressive disorder, recurrent, severe with psychotic symptoms: Secondary | ICD-10-CM | POA: Diagnosis not present

## 2020-06-07 DIAGNOSIS — Z8616 Personal history of COVID-19: Secondary | ICD-10-CM

## 2020-06-07 DIAGNOSIS — F411 Generalized anxiety disorder: Secondary | ICD-10-CM | POA: Diagnosis present

## 2020-06-07 DIAGNOSIS — Z79899 Other long term (current) drug therapy: Secondary | ICD-10-CM | POA: Diagnosis not present

## 2020-06-07 DIAGNOSIS — Z9151 Personal history of suicidal behavior: Secondary | ICD-10-CM

## 2020-06-07 DIAGNOSIS — Z7982 Long term (current) use of aspirin: Secondary | ICD-10-CM | POA: Diagnosis not present

## 2020-06-07 DIAGNOSIS — S1191XA Laceration without foreign body of unspecified part of neck, initial encounter: Secondary | ICD-10-CM | POA: Diagnosis not present

## 2020-06-07 DIAGNOSIS — Z7984 Long term (current) use of oral hypoglycemic drugs: Secondary | ICD-10-CM | POA: Diagnosis not present

## 2020-06-07 DIAGNOSIS — F332 Major depressive disorder, recurrent severe without psychotic features: Secondary | ICD-10-CM | POA: Diagnosis present

## 2020-06-07 DIAGNOSIS — G4700 Insomnia, unspecified: Secondary | ICD-10-CM | POA: Diagnosis present

## 2020-06-07 HISTORY — DX: Anxiety disorder, unspecified: F41.9

## 2020-06-07 LAB — GLUCOSE, CAPILLARY
Glucose-Capillary: 126 mg/dL — ABNORMAL HIGH (ref 70–99)
Glucose-Capillary: 137 mg/dL — ABNORMAL HIGH (ref 70–99)
Glucose-Capillary: 156 mg/dL — ABNORMAL HIGH (ref 70–99)
Glucose-Capillary: 101 mg/dL — ABNORMAL HIGH (ref 70–99)
Glucose-Capillary: 114 mg/dL — ABNORMAL HIGH (ref 70–99)

## 2020-06-07 MED ORDER — ARIPIPRAZOLE 5 MG PO TABS
5.0000 mg | ORAL_TABLET | Freq: Every day | ORAL | Status: DC
  Administered 2020-06-08 – 2020-06-11 (×4): 5 mg via ORAL
  Filled 2020-06-07 (×7): qty 1

## 2020-06-07 MED ORDER — POLYETHYLENE GLYCOL 3350 17 G PO PACK
17.0000 g | PACK | Freq: Two times a day (BID) | ORAL | Status: DC
  Filled 2020-06-07 (×13): qty 1

## 2020-06-07 MED ORDER — ZINC SULFATE 220 (50 ZN) MG PO CAPS: 220.0000 mg | ORAL_CAPSULE | Freq: Every day | ORAL | Status: AC

## 2020-06-07 MED ORDER — TRAZODONE HCL 50 MG PO TABS
50.0000 mg | ORAL_TABLET | Freq: Every day | ORAL | Status: DC
  Administered 2020-06-07 – 2020-06-10 (×4): 50 mg via ORAL
  Filled 2020-06-07 (×9): qty 1

## 2020-06-07 MED ORDER — ALUM & MAG HYDROXIDE-SIMETH 200-200-20 MG/5ML PO SUSP: 30.0000 mL | ORAL | Status: DC | PRN

## 2020-06-07 MED ORDER — METFORMIN HCL 500 MG PO TABS: 500.0000 mg | ORAL_TABLET | Freq: Two times a day (BID) | ORAL | Status: AC

## 2020-06-07 MED ORDER — METOPROLOL TARTRATE 5 MG/5ML IV SOLN
5.0000 mg | Freq: Four times a day (QID) | INTRAVENOUS | Status: DC | PRN
  Filled 2020-06-07: qty 5

## 2020-06-07 MED ORDER — MAGNESIUM HYDROXIDE 400 MG/5ML PO SUSP: 30.0000 mL | Freq: Every day | ORAL | Status: DC | PRN

## 2020-06-07 MED ORDER — ZINC SULFATE 220 (50 ZN) MG PO CAPS
220.0000 mg | ORAL_CAPSULE | Freq: Every day | ORAL | Status: DC
  Administered 2020-06-08 – 2020-06-11 (×4): 220 mg via ORAL
  Filled 2020-06-07 (×6): qty 1

## 2020-06-07 MED ORDER — PHENOL 1.4 % MT LIQD
1.0000 | OROMUCOSAL | Status: DC | PRN
  Administered 2020-06-08 – 2020-06-11 (×11): 1 via OROMUCOSAL
  Filled 2020-06-07: qty 177

## 2020-06-07 MED ORDER — ASPIRIN 81 MG PO CHEW: 81.0000 mg | CHEWABLE_TABLET | Freq: Every day | ORAL | Status: AC

## 2020-06-07 MED ORDER — INSULIN ASPART 100 UNIT/ML ~~LOC~~ SOLN: 0.0000 [IU] | Freq: Every day | SUBCUTANEOUS | Status: DC

## 2020-06-07 MED ORDER — IBUPROFEN 600 MG PO TABS: 600.0000 mg | ORAL_TABLET | Freq: Four times a day (QID) | ORAL | Status: DC | PRN

## 2020-06-07 MED ORDER — OXYCODONE HCL 5 MG PO TABS: 5.0000 mg | ORAL_TABLET | ORAL | Status: DC | PRN

## 2020-06-07 MED ORDER — FLUOXETINE HCL 20 MG PO CAPS
20.0000 mg | ORAL_CAPSULE | Freq: Every day | ORAL | Status: DC
  Administered 2020-06-08 – 2020-06-09 (×2): 20 mg via ORAL
  Filled 2020-06-07 (×5): qty 1

## 2020-06-07 MED ORDER — ACETAMINOPHEN 500 MG PO TABS
1000.0000 mg | ORAL_TABLET | Freq: Four times a day (QID) | ORAL | Status: DC
  Administered 2020-06-08 – 2020-06-11 (×10): 1000 mg via ORAL
  Filled 2020-06-07 (×25): qty 2

## 2020-06-07 MED ORDER — ONDANSETRON HCL 4 MG/2ML IJ SOLN: 4.0000 mg | Freq: Four times a day (QID) | INTRAMUSCULAR | Status: DC | PRN

## 2020-06-07 MED ORDER — DOCUSATE SODIUM 100 MG PO CAPS
100.0000 mg | ORAL_CAPSULE | Freq: Two times a day (BID) | ORAL | Status: DC
  Administered 2020-06-07: 100 mg via ORAL
  Filled 2020-06-07 (×14): qty 1

## 2020-06-07 MED ORDER — IBUPROFEN 600 MG PO TABS: 600.0000 mg | ORAL_TABLET | Freq: Four times a day (QID) | ORAL | 0 refills | Status: AC | PRN

## 2020-06-07 MED ORDER — DM-GUAIFENESIN ER 30-600 MG PO TB12: 1.0000 | ORAL_TABLET | Freq: Two times a day (BID) | ORAL | Status: DC | PRN

## 2020-06-07 MED ORDER — ORAL CARE MOUTH RINSE
15.0000 mL | Freq: Two times a day (BID) | OROMUCOSAL | Status: DC
  Administered 2020-06-08 – 2020-06-11 (×6): 15 mL via OROMUCOSAL
  Filled 2020-06-07 (×12): qty 15

## 2020-06-07 MED ORDER — ACETAMINOPHEN 500 MG PO TABS
1000.0000 mg | ORAL_TABLET | Freq: Four times a day (QID) | ORAL | 0 refills | Status: DC | PRN
Start: 2020-06-07 — End: 2020-06-12

## 2020-06-07 MED ORDER — ASPIRIN 81 MG PO CHEW
81.0000 mg | CHEWABLE_TABLET | Freq: Every day | ORAL | Status: DC
  Administered 2020-06-08 – 2020-06-11 (×4): 81 mg via ORAL
  Filled 2020-06-07 (×7): qty 1

## 2020-06-07 MED ORDER — INSULIN ASPART 100 UNIT/ML ~~LOC~~ SOLN
0.0000 [IU] | Freq: Three times a day (TID) | SUBCUTANEOUS | Status: DC
  Administered 2020-06-08: 1 [IU] via SUBCUTANEOUS
  Administered 2020-06-09: 2 [IU] via SUBCUTANEOUS

## 2020-06-07 MED ORDER — METFORMIN HCL 500 MG PO TABS
500.0000 mg | ORAL_TABLET | Freq: Two times a day (BID) | ORAL | Status: DC
  Administered 2020-06-08 – 2020-06-11 (×7): 500 mg via ORAL
  Filled 2020-06-07 (×13): qty 1

## 2020-06-07 MED ORDER — ASCORBIC ACID 500 MG PO TABS: 500.0000 mg | ORAL_TABLET | Freq: Every day | ORAL | Status: AC

## 2020-06-07 MED ORDER — POLYETHYLENE GLYCOL 3350 17 G PO PACK: 17.0000 g | PACK | Freq: Every day | ORAL | 0 refills | Status: AC | PRN

## 2020-06-07 MED ORDER — ASCORBIC ACID 500 MG PO TABS
500.0000 mg | ORAL_TABLET | Freq: Every day | ORAL | Status: DC
  Administered 2020-06-08 – 2020-06-11 (×4): 500 mg via ORAL
  Filled 2020-06-07 (×7): qty 1
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
## 2020-06-07 NOTE — TOC Transition Note (Signed)
Transition of Care [facility]) - CM/SW Discharge Note   Patient Details  Name: George Hobbs MRN: [id number] Date of Birth: 29-Apr-1981  Transition of Care [facility]) CM/SW Contact:  Glennon Mac, RN Phone Number: 06/07/2020, 4:39 PM   Clinical Narrative: Bed is available at [facility] today, per [facility] [place] with [facility].  Dr Jola Babinski is accepting physician; bedside nurse to call report to [phone].  Pt going to room 400-2.  [facility] PD has been notified for pickup after 9pm, as bed not available until then.    [place] PD (non-emergent):  [phone]      Final next level of care: [facility] Hospital Barriers to Discharge: Barriers Resolved            Discharge Plan and Services In-house Referral: Clinical Social Work Discharge Planning Services: CM Consult                                 Social Determinants of Health (SDOH) Interventions     Readmission Risk Interventions No flowsheet data found.  Quintella Baton, RN, BSN  Trauma/Neuro ICU Case Manager [phone]

## 2020-06-07 NOTE — Progress Notes (Addendum)
12 Days Post-Op  Subjective: CC: Doing well. No acute changes overnight. No pain. Tolerating diet without n/v. Mobilizing. Voiding.   Objective: Vital signs in last 24 hours: Temp:  [97.5 F (36.4 C)-98.5 F (36.9 C)] 97.5 F (36.4 C) (11/16 0500) Pulse Rate:  [73-84] 73 (11/16 0500) Resp:  [16-18] 16 (11/16 0500) BP: (125-136)/(81-87) 130/87 (11/16 0500) SpO2:  [98 %-100 %] 99 % (11/16 0500) Last BM Date: 06/05/20  Intake/Output from previous day: 11/15 0701 - 11/16 0700 In: 600 [P.O.:600] Out: -  Intake/Output this shift: No intake/output data recorded.  PE: General: pleasant, NAD HEENT: neck slightly edematous on the right but no cellulitis or drainage around incision. Sutures removed 11/15 Heart: regular, rate, and rhythm. Lungs: CTAB Abd: soft, NT, ND, +BS MS:MAE. LUE with some firmness at antecubital without signs of infection.  Psych: A&Ox3 with an appropriate affect.   Lab Results:  No results for input(s): WBC, HGB, HCT, PLT in the last 72 hours. BMET No results for input(s): NA, K, CL, CO2, GLUCOSE, BUN, CREATININE, CALCIUM in the last 72 hours. PT/INR No results for input(s): LABPROT, INR in the last 72 hours. CMP     Component Value Date/Time   NA 137 06/02/2020 0746   K 3.6 06/02/2020 0746   CL 104 06/02/2020 0746   CO2 25 06/02/2020 0746   GLUCOSE 134 (H) 06/02/2020 0746   BUN 12 06/02/2020 0746   CREATININE 0.85 06/02/2020 0746   CALCIUM 8.1 (L) 06/02/2020 0746   PROT 6.3 (L) 06/01/2020 0024   ALBUMIN 3.1 (L) 06/01/2020 0024   AST 15 06/01/2020 0024   ALT 37 06/01/2020 0024   ALKPHOS 40 06/01/2020 0024   BILITOT 1.1 06/01/2020 0024   GFRNONAA >60 06/02/2020 0746   Lipase  No results found for: LIPASE     Studies/Results: VAS [place] UPPER EXTREMITY VENOUS DUPLEX  Result Date: 06/06/2020 UPPER VENOUS STUDY  Indications: Swelling, and Pain Comparison Study: no prior Performing Technologist: Blanch Media RVS  Examination Guidelines: A  complete evaluation includes B-mode imaging, spectral Doppler, color Doppler, and power Doppler as needed of all accessible portions of each vessel. Bilateral testing is considered an integral part of a complete examination. Limited examinations for reoccurring indications may be performed as noted.  Left Findings: +----------+------------+---------+-----------+----------+-----------------+ LEFT      CompressiblePhasicitySpontaneousProperties     Summary      +----------+------------+---------+-----------+----------+-----------------+ IJV           Full       Yes       Yes                                +----------+------------+---------+-----------+----------+-----------------+ Subclavian    Full       Yes       Yes                                +----------+------------+---------+-----------+----------+-----------------+ Axillary      Full       Yes       Yes                                +----------+------------+---------+-----------+----------+-----------------+ Brachial      Full       Yes       Yes                                +----------+------------+---------+-----------+----------+-----------------+  Radial        Full                                                    +----------+------------+---------+-----------+----------+-----------------+ Ulnar         Full                                                    +----------+------------+---------+-----------+----------+-----------------+ Cephalic      Full                                                    +----------+------------+---------+-----------+----------+-----------------+ Basilic       None                                  Age Indeterminate +----------+------------+---------+-----------+----------+-----------------+  Summary:  Left: No evidence of deep vein thrombosis in the upper extremity. Findings consistent with age indeterminate superficial vein thrombosis involving the left  basilic vein.  *See table(s) above for measurements and observations.  Diagnosing physician: Lemar Livings MD Electronically signed by Lemar Livings MD on 06/06/2020 at 4:30:30 PM.    Final     Anti-infectives: Anti-infectives (From admission, onward)   Start     Dose/Rate Route Frequency Ordered Stop   05/28/20 1000  remdesivir 100 mg in sodium chloride 0.9 % 100 mL IVPB       "Followed by" Linked Group Details   100 mg 200 mL/hr over 30 Minutes Intravenous Daily 05/27/20 1114 05/31/20 1926   05/27/20 1200  remdesivir 200 mg in sodium chloride 0.9% 250 mL IVPB       "Followed by" Linked Group Details   200 mg 580 mL/hr over 30 Minutes Intravenous Once 05/27/20 1114 05/27/20 1318   05/26/20 0600  ceFAZolin (ANCEF) IVPB 1 g/50 mL premix  Status:  Discontinued        1 g 100 mL/hr over 30 Minutes Intravenous Every 8 hours 05/26/20 0224 05/27/20 0943       Assessment/Plan SI SW R neck S/P R neck exploration with ligation of injuries to external jugular vein and R lingual artery by Dr. Elijah Birk, Dr. Arbie Cookey, and Dr. Annia Friendly 11/4 - wound clean, penrose drainremoved 11/5.No signs of leak. Sutures removed 11/15 Acute hypoxic ventilator dependent respiratory failure-Extubated 11/4.Off supplemental oxygen.  COVID+-appreciate TRH consult,completed course of steroidsand Remdesivir; off precautions 11/13 SI-seen by psych 11/5 and 11/9 and rec inpatient psych at discharge.suicide precautions, home meds. Recently d/c'd from [facility] on 10/6. Psych reconsulted.  ABL anemia-Hgbstable  DM2 - SSI. Home metformin on hold for now. A1c 6.4 Superficial vein thrombosis involving the left basilic vein - No DVT on [place]. TRH spoke with vascular who recommended ASA. No signs of infection. Continue warm compression, elevation, ASA  FEN-soft diet.Continue colace andmiralax BID VTE- PAS, lovenox, ASA ID - Ancef11/4>11/5. remdesivir 11/6-11/9 Foley - D/c'd. Voiding  Dispo-Stable for d/c to  Psych. Psych reconsulted.    LOS: 12 days    Jacinto Halim ,  [facility] 06/07/2020, 7:56 AM Please see Amion for pager number during day hours 7:00am-4:30pm

## 2020-06-07 NOTE — Progress Notes (Signed)
AVS given to pt. Pt denies pain or discomfort. Report called given to [facility]. Pt picked up and accompanied by Harvie Heck with [facility] police.

## 2020-06-07 NOTE — TOC Progression Note (Signed)
Transition of Care [facility]) - Progression Note    Patient Details  Name: George Hobbs MRN: [id number] Date of Birth: Jun 03, 1981  Transition of Care [facility] CM/SW Contact  Glennon Mac, RN Phone Number: 06/07/2020, 12:01 PM  Clinical Narrative:  Sherron Monday with Paulla Dolly at [facility]; she confirms that pt is on list for review, and she will be reviewing shortly.      Expected Discharge Plan: Psychiatric Hospital Barriers to Discharge: Continued Medical Work up  Expected Discharge Plan and Services Expected Discharge Plan: Psychiatric Hospital In-house Referral: Clinical Social Work Discharge Planning Services: CM Consult   Living arrangements for the past 2 months: Single Family Home                                       Social Determinants of Health (SDOH) Interventions    Readmission Risk Interventions No flowsheet data found.  Quintella Baton, RN, BSN  Trauma/Neuro ICU Case Manager [phone]
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
## 2020-06-08 ENCOUNTER — Encounter (HOSPITAL_COMMUNITY): Payer: Self-pay | Admitting: Family

## 2020-06-08 DIAGNOSIS — F333 Major depressive disorder, recurrent, severe with psychotic symptoms: Secondary | ICD-10-CM

## 2020-06-08 LAB — GLUCOSE, CAPILLARY
Glucose-Capillary: 108 mg/dL — ABNORMAL HIGH (ref 70–99)
Glucose-Capillary: 108 mg/dL — ABNORMAL HIGH (ref 70–99)
Glucose-Capillary: 139 mg/dL — ABNORMAL HIGH (ref 70–99)
Glucose-Capillary: 115 mg/dL — ABNORMAL HIGH (ref 70–99)

## 2020-06-08 NOTE — BHH Group Notes (Signed)
The focus of this group is to help patients establish daily goals to achieve during treatment and discuss how the patient can incorporate goal setting into their daily lives to aide in recovery.  Pt stated that his goal is to," Start to develop a sense of self worth by listing positive aspects." (about him)
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
## 2020-06-08 NOTE — Tx Team (Signed)
Interdisciplinary Treatment and Diagnostic Plan Update  06/08/2020 Time of Session: 9am Pheng Prokop MRN: [id number]  Principal Diagnosis: <principal problem not specified>  Secondary Diagnoses: Active Problems:   MDD (major depressive disorder), recurrent episode, severe (HCC)   Current Medications:  Current Facility-Administered Medications  Medication Dose Route Frequency Provider Last Rate Last Admin  . acetaminophen (TYLENOL) tablet 1,000 mg  1,000 mg Oral Q6H Maryagnes Amos, FNP   1,000 mg at 06/08/20 0830  . alum & mag hydroxide-simeth (MAALOX/MYLANTA) 200-200-20 MG/5ML suspension 30 mL  30 mL Oral Q4H PRN Rosario Adie, Juel Burrow, FNP      . ARIPiprazole (ABILIFY) tablet 5 mg  5 mg Oral Daily Maryagnes Amos, FNP   5 mg at 06/08/20 0829  . ascorbic acid (VITAMIN C) tablet 500 mg  500 mg Oral Daily Maryagnes Amos, FNP   500 mg at 06/08/20 0829  . aspirin chewable tablet 81 mg  81 mg Oral Daily Maryagnes Amos, FNP   81 mg at 06/08/20 3154  . dextromethorphan-guaiFENesin (MUCINEX DM) 30-600 MG per 12 hr tablet 1 tablet  1 tablet Oral BID PRN Maryagnes Amos, FNP      . docusate sodium (COLACE) capsule 100 mg  100 mg Oral BID Maryagnes Amos, FNP   100 mg at 06/07/20 2308  . FLUoxetine (PROZAC) capsule 20 mg  20 mg Oral Daily Maryagnes Amos, FNP   20 mg at 06/08/20 0829  . ibuprofen (ADVIL) tablet 600 mg  600 mg Oral Q6H PRN Maryagnes Amos, FNP      . insulin aspart (novoLOG) injection 0-5 Units  0-5 Units Subcutaneous QHS Maryagnes Amos, FNP      . insulin aspart (novoLOG) injection 0-9 Units  0-9 Units Subcutaneous TID WC Starkes-Perry, Takia S, FNP      . magnesium hydroxide (MILK OF MAGNESIA) suspension 30 mL  30 mL Oral Daily PRN Rosario Adie, Juel Burrow, FNP      . MEDLINE mouth rinse  15 mL Mouth Rinse BID Caryn Bee S, FNP      . metFORMIN (GLUCOPHAGE) tablet 500 mg  500 mg Oral BID WC Maryagnes Amos, FNP   500 mg at 06/08/20 0828  . metoprolol tartrate (LOPRESSOR) injection 5 mg  5 mg Intravenous Q6H PRN Starkes-Perry, Juel Burrow, FNP      . ondansetron (ZOFRAN) injection 4 mg  4 mg Intravenous Q6H PRN Starkes-Perry, Takia S, FNP      . oxyCODONE (Oxy IR/ROXICODONE) immediate release tablet 5 mg  5 mg Oral Q4H PRN Maryagnes Amos, FNP      . phenol (CHLORASEPTIC) mouth spray 1 spray  1 spray Mouth/Throat PRN Maryagnes Amos, FNP   1 spray at 06/08/20 0910  . polyethylene glycol (MIRALAX / GLYCOLAX) packet 17 g  17 g Oral BID Maryagnes Amos, FNP      . traZODone (DESYREL) tablet 50 mg  50 mg Oral QHS Maryagnes Amos, FNP   50 mg at 06/07/20 2308  . zinc sulfate capsule 220 mg  220 mg Oral Daily Maryagnes Amos, FNP   220 mg at 06/08/20 0908   PTA Medications: Medications Prior to Admission  Medication Sig Dispense Refill Last Dose  . acetaminophen (TYLENOL) 500 MG tablet Take 2 tablets (1,000 mg total) by mouth every 6 (six) hours as needed. 30 tablet 0   . ARIPiprazole (ABILIFY) 2 MG tablet Take 2 mg by mouth daily.     Marland Kitchen  ascorbic acid (VITAMIN C) 500 MG tablet Take 1 tablet (500 mg total) by mouth daily.     Marland Kitchen aspirin 81 MG chewable tablet Chew 1 tablet (81 mg total) by mouth daily.     Marland Kitchen dextromethorphan-guaiFENesin (MUCINEX DM) 30-600 MG 12hr tablet Take 1 tablet by mouth 2 (two) times daily as needed for cough.     Marland Kitchen FLUoxetine (PROZAC) 20 MG capsule Take 20 mg by mouth daily.     Marland Kitchen ibuprofen (ADVIL) 600 MG tablet Take 1 tablet (600 mg total) by mouth every 6 (six) hours as needed for mild pain. 30 tablet 0   . metFORMIN (GLUCOPHAGE) 500 MG tablet Take 1 tablet (500 mg total) by mouth 2 (two) times daily with a meal.     . polyethylene glycol (MIRALAX / GLYCOLAX) 17 g packet Take 17 g by mouth daily as needed. 14 each 0   . traZODone (DESYREL) 50 MG tablet Take 50 mg by mouth at bedtime.      Marland Kitchen zinc sulfate 220 (50 Zn) MG capsule Take 1 capsule  (220 mg total) by mouth daily.       Patient Stressors: Marital or family conflict  Patient Strengths: Average or above average intelligence Capable of independent living Motivation for treatment/growth Supportive family/friends  Treatment Modalities: Medication Management, Group therapy, Case management,  1 to 1 session with clinician, Psychoeducation, Recreational therapy.   Physician Treatment Plan for Primary Diagnosis: <principal problem not specified> Long Term Goal(s):     Short Term Goals:    Medication Management: Evaluate patient's response, side effects, and tolerance of medication regimen.  Therapeutic Interventions: 1 to 1 sessions, Unit Group sessions and Medication administration.  Evaluation of Outcomes: Progressing  Physician Treatment Plan for Secondary Diagnosis: Active Problems:   MDD (major depressive disorder), recurrent episode, severe (HCC)  Long Term Goal(s):     Short Term Goals:       Medication Management: Evaluate patient's response, side effects, and tolerance of medication regimen.  Therapeutic Interventions: 1 to 1 sessions, Unit Group sessions and Medication administration.  Evaluation of Outcomes: Progressing   RN Treatment Plan for Primary Diagnosis: <principal problem not specified> Long Term Goal(s): Knowledge of disease and therapeutic regimen to maintain health will improve  Short Term Goals: Ability to verbalize feelings will improve, Ability to identify and develop effective coping behaviors will improve and Compliance with prescribed medications will improve  Medication Management: RN will administer medications as ordered by provider, will assess and evaluate patient's response and provide education to patient for prescribed medication. RN will report any adverse and/or side effects to prescribing provider.  Therapeutic Interventions: 1 on 1 counseling sessions, Psychoeducation, Medication administration, Evaluate responses to  treatment, Monitor vital signs and CBGs as ordered, Perform/monitor CIWA, COWS, AIMS and Fall Risk screenings as ordered, Perform wound care treatments as ordered.  Evaluation of Outcomes: Progressing   LCSW Treatment Plan for Primary Diagnosis: <principal problem not specified> Long Term Goal(s): Safe transition to appropriate next level of care at discharge, Engage patient in therapeutic group addressing interpersonal concerns.  Short Term Goals: Engage patient in aftercare planning with referrals and resources, Increase ability to appropriately verbalize feelings and Facilitate acceptance of mental health diagnosis and concerns  Therapeutic Interventions: Assess for all discharge needs, 1 to 1 time with Social worker, Explore available resources and support systems, Assess for adequacy in community support network, Educate family and significant other(s) on suicide prevention, Complete Psychosocial Assessment, Interpersonal group therapy.  Evaluation of Outcomes:  Progressing   Progress in Treatment: Attending groups: Yes. Participating in groups: Yes. Taking medication as prescribed: Yes. and No. Toleration medication: Yes. Family/Significant other contact made: No, will contact:  CSW will obtain consents and make contact Patient understands diagnosis: Yes. Discussing patient identified problems/goals with staff: Yes. Medical problems stabilized or resolved: Yes. Denies suicidal/homicidal ideation: Yes. Issues/concerns per patient self-inventory: Yes. Other:   New problem(s) identified: No, Describe:  CSW will continue to assess  New Short Term/Long Term Goal(s): medication stabilization, elimination of SI thoughts, development of comprehensive mental wellness plan.  Patient Goals:  "to rebuild my self-worth."  Discharge Plan or Barriers: Patient recently admitted. CSW will continue to follow and assess for appropriate referrals and possible discharge planning.  Reason for  Continuation of Hospitalization: Depression Medication stabilization Suicidal ideation  Estimated Length of Stay: 3-5 days  Attendees: Patient: George Hobbs 06/08/2020 9:49 AM  Physician: Dr. Cindi Carbon 06/08/2020 9:49 AM  Nursing:  06/08/2020 9:49 AM  RN Care Manager: 06/08/2020 9:49 AM  Social Worker: Fredirick Lathe, LCSWA 06/08/2020 9:49 AM  Recreational Therapist:  06/08/2020 9:49 AM  Other:  06/08/2020 9:49 AM  Other:  06/08/2020 9:49 AM  Other: 06/08/2020 9:49 AM    Scribe for Treatment Team: Felizardo Hoffmann, LCSWA 06/08/2020 9:49 AM

## 2020-06-08 NOTE — Progress Notes (Signed)
Pt stated he felt better about things moving forward, pt plans to stay with mom, go to therapy after D/C    06/08/20 2300  Psych Admission Type (Psych Patients Only)  Admission Status Voluntary  Psychosocial Assessment  Patient Complaints Anxiety  Eye Contact Fair  Facial Expression Anxious  Affect Anxious  Speech Logical/coherent  Interaction Assertive  Motor Activity Other (Comment) (wnl)  Appearance/Hygiene Unremarkable  Behavior Characteristics Cooperative  Mood Depressed  Thought Process  Coherency WDL  Content Blaming self  Delusions None reported or observed  Perception WDL  Hallucination None reported or observed  Judgment Poor  Confusion None  Danger to Self  Current suicidal ideation? Denies  Danger to Others  Danger to Others None reported or observed
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
## 2020-06-08 NOTE — Progress Notes (Signed)
   06/07/20 2200  Psych Admission Type (Psych Patients Only)  Admission Status Voluntary  Psychosocial Assessment  Patient Complaints Anxiety;Depression  Eye Contact Fair  Facial Expression Anxious  Affect Anxious  Speech Logical/coherent  Interaction Assertive  Motor Activity Other (Comment) (wnl)  Appearance/Hygiene Unremarkable  Behavior Characteristics Cooperative;Anxious  Mood Anxious;Depressed;Pleasant  Thought Process  Coherency WDL  Content Blaming self  Delusions None reported or observed  Perception WDL  Hallucination None reported or observed  Judgment Poor  Confusion None  Danger to Self  Current suicidal ideation? Denies  Danger to Others  Danger to Others None reported or observed

## 2020-06-08 NOTE — Progress Notes (Signed)
Pt denies SI/HI/AVH.  Stated that he is "ready to go home".  Pt is pleasant, cooperative and takes medications without incident.  RN established rapport with pt, administered medications, and assessed for needs and concerns.  Pt has been interacting with peers on the unit.  Pt remains safe with q 15 min safety checks.

## 2020-06-08 NOTE — BHH Group Notes (Signed)
Pt did not attend wrap up group this evening. Pt was in the bed resting.
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
## 2020-06-08 NOTE — [facility] Group Notes (Signed)
[facility] LCSW Group Therapy  06/08/2020 1:49 PM  Type of Therapy:  Group Therapy: Discussion of CBT focused on core beliefs and emotions.  Participation Level:  Active  Participation Quality:  Appropriate and Attentive  Engagement in Therapy:  Engaged  Modes of Intervention:  Discussion and Education  Summary of Progress/Problems: Pt accepted handouts. Pt did not share during the main discussion but did share during the icebreaker. Pt shared that if he could go anywhere in the world he would go to [place] and take his wife and daughter.    George Hobbs 06/08/2020, 1:49 PM

## 2020-06-08 NOTE — Progress Notes (Signed)
Recreation Therapy Notes  Date: 11.17.21  Time: 0930  Location: [address] Dayroom   Group Topic: Stress Management\   Goal Area(s) Addresses:  Patient will identify positive stress management techniques.  Patient will identify benefits of using stress management post d/c.   Intervention: Stress Management   Activity : Meditation. LRT read a script that took patients on a journey into a wildlife sanctuary. Patients were to listen and follow as script was read to participate in activity.   Education: Stress Management, Discharge Planning.   Education Outcome: Acknowledges Education   Clinical Observations/Feedback: Pt did not attend activity.     Caroll Rancher, LRT/CTRS         Lillia Abed, Kylin Genna A 06/08/2020 11:20 AM

## 2020-06-09 LAB — GLUCOSE, CAPILLARY
Glucose-Capillary: 118 mg/dL — ABNORMAL HIGH (ref 70–99)
Glucose-Capillary: 118 mg/dL — ABNORMAL HIGH (ref 70–99)
Glucose-Capillary: 165 mg/dL — ABNORMAL HIGH (ref 70–99)
Glucose-Capillary: 92 mg/dL (ref 70–99)

## 2020-06-09 MED ORDER — FLUOXETINE HCL 10 MG PO CAPS
30.0000 mg | ORAL_CAPSULE | Freq: Every day | ORAL | Status: DC
  Administered 2020-06-10 – 2020-06-11 (×2): 30 mg via ORAL
  Filled 2020-06-09 (×4): qty 3

## 2020-06-09 MED ORDER — BACITRACIN-NEOMYCIN-POLYMYXIN OINTMENT TUBE
TOPICAL_OINTMENT | Freq: Three times a day (TID) | CUTANEOUS | Status: DC
  Filled 2020-06-09 (×2): qty 14.17

## 2020-06-09 NOTE — [facility] Suicide Risk Assessment (Signed)
[facility] INPATIENT:  Family/Significant Other Suicide Prevention Education  Suicide Prevention Education:  Education Completed; Rufino Staup [phone] (Mother) has been identified by the patient as the family member/significant other with whom the patient will be residing, and identified as the person(s) who will aid the patient in the event of a mental health crisis (suicidal ideations/suicide attempt).  With written consent from the patient, the family member/significant other has been provided the following suicide prevention education, prior to the and/or following the discharge of the patient.  The suicide prevention education provided includes the following:  Suicide risk factors  Suicide prevention and interventions  National Suicide Hotline telephone number  [facility] assessment telephone number  [facility] Emergency Assistance 911  [facility] and/or Residential Mobile Crisis Unit telephone number  Request made of family/significant other to:  Remove weapons (e.g., guns, rifles, knives), all items previously/currently identified as safety concern.    Remove drugs/medications (over-the-counter, prescriptions, illicit drugs), all items previously/currently identified as a safety concern.  The family member/significant other verbalizes understanding of the suicide prevention education information provided.  The family member/significant other agrees to remove the items of safety concern listed above.   Mrs. Hillery states that she feels her son has lost all of his self-worth and she wants him to come live with her while he works on himself.  Mrs. Appleyard states that his marriage is not working and his depression and SI have been based around his relationship.  Mrs. Albus states that she had lunch with his wife yesterday and the wife states that she and their daughters are afraid of him because he hurt himself.  Mrs. Choyce states that her son has never been  aggressive and is extremely passive.  Mrs. Bord states that her son's wife treats their children more like friends and believes that the relationship with his teenage children and wife is hurting him emotionally.  Mrs. Geathers states that her son has agreed to come stay with her and states that she will make sure he takes his medications and goes to his appointments.  Mrs. Blank states that his friend/adoptive brother is coming into town this weekend and she would like her son to be discharged on Sunday so he can see him but only if it is approriate for his health.  Mrs. Brook states that there are no firearms or weapons in the home and she will be monitoring his use of things in the kitchen. CSW completed SPE with Mrs. Ingraham.    Metro Kung Gerene Nedd 06/09/2020, 2:13 PM

## 2020-06-09 NOTE — Progress Notes (Addendum)
Pt denies SI/HI/AVH.  Denies feeling anxious or depressed.  Pt responding well to scheduled Tylenol for pain relief and pt uses chloraseptic spray prn for throat soreness.  Pt's blood sugars have been stable.  RN provided support and assessed for needs and concerns.  Pt remains safe with q 15 min checks in place.

## 2020-06-09 NOTE — [facility] Group Notes (Signed)
The focus of this group is to help patients establish daily goals to achieve during treatment and discuss how the patient can incorporate goal setting into their daily lives to aide in recovery.   Patient attended group and contributed to group. 

## 2020-06-10 LAB — COMPREHENSIVE METABOLIC PANEL
ALT: 22 U/L (ref 0–44)
AST: 18 U/L (ref 15–41)
Albumin: 4 g/dL (ref 3.5–5.0)
Alkaline Phosphatase: 46 U/L (ref 38–126)
Anion gap: 8 (ref 5–15)
BUN: 13 mg/dL (ref 6–20)
CO2: 27 mmol/L (ref 22–32)
Calcium: 8.8 mg/dL — ABNORMAL LOW (ref 8.9–10.3)
Chloride: 104 mmol/L (ref 98–111)
Creatinine, Ser: 0.96 mg/dL (ref 0.61–1.24)
GFR, Estimated: >60 mL/min (ref >60)
Glucose, Bld: 175 mg/dL — ABNORMAL HIGH (ref 70–99)
Potassium: 3.9 mmol/L (ref 3.5–5.1)
Sodium: 139 mmol/L (ref 135–145)
Total Bilirubin: 0.6 mg/dL (ref 0.3–1.2)
Total Protein: 7.6 g/dL (ref 6.5–8.1)

## 2020-06-10 LAB — CBC WITH DIFFERENTIAL/PLATELET
Abs Immature Granulocytes: 0.06 10*3/uL (ref 0.00–0.07)
Basophils Absolute: 0 10*3/uL (ref 0.0–0.1)
Basophils Relative: 0 %
Eosinophils Absolute: 0.1 10*3/uL (ref 0.0–0.5)
Eosinophils Relative: 1 %
HCT: 36.4 % — ABNORMAL LOW (ref 39.0–52.0)
Hemoglobin: 12.1 g/dL — ABNORMAL LOW (ref 13.0–17.0)
Immature Granulocytes: 1 %
Lymphocytes Relative: 17 %
Lymphs Abs: 1.9 10*3/uL (ref 0.7–4.0)
MCH: 30.3 pg (ref 26.0–34.0)
MCHC: 33.2 g/dL (ref 30.0–36.0)
MCV: 91 fL (ref 80.0–100.0)
Monocytes Absolute: 0.8 10*3/uL (ref 0.1–1.0)
Monocytes Relative: 7 %
Neutro Abs: 8.2 10*3/uL — ABNORMAL HIGH (ref 1.7–7.7)
Neutrophils Relative %: 74 %
Platelets: 442 10*3/uL — ABNORMAL HIGH (ref 150–400)
RBC: 4 MIL/uL — ABNORMAL LOW (ref 4.22–5.81)
RDW: 14.6 % (ref 11.5–15.5)
WBC: 11.1 10*3/uL — ABNORMAL HIGH (ref 4.0–10.5)
nRBC: 0 % (ref 0.0–0.2)

## 2020-06-10 LAB — GLUCOSE, CAPILLARY
Glucose-Capillary: 109 mg/dL — ABNORMAL HIGH (ref 70–99)
Glucose-Capillary: 92 mg/dL (ref 70–99)
Glucose-Capillary: 176 mg/dL — ABNORMAL HIGH (ref 70–99)
Glucose-Capillary: 131 mg/dL — ABNORMAL HIGH (ref 70–99)
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
## 2020-06-10 NOTE — Progress Notes (Signed)
The focus of this group is to help patients establish daily goals to achieve during treatment and discuss how the patient can incorporate goal setting into their daily lives to aide in recovery. 
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
## 2020-06-10 NOTE — [facility] Group Notes (Signed)
LCSW Group Therapy Note  06/10/2020   1430  Type of Therapy and Topic:  Group Therapy: What's So Funny About Mental Illness?  Participation Level:  Active   Description of Group:   In this group, patients learned how to recognize how they personally define their mental illness. This group also addressed stigma associated with mental illness.  Patients were asked to give examples of experiences surrounding living with a mental illness and how it makes them feel. Patients were asked to self-reflect on how they have chosen to address their mental health thus far and were invited to share those lessons or to discuss how stigma has affected their mental health care. Patients actively explore how their mental illness has impacted decisions and actions as well as how future decisions can be impacted.  Therapeutic Goals: 1. Patients will identify what can be considered mental illness 2. Patients will identify lessons learned from past experiences and how they can be applied to future struggles. 3. Patients will establish rapport with peers in a therapeutic setting.  Summary of Patient Progress:  The patient shared that his mental health is a concern at home and that he is working on it and that there is information he has learned in the last couple weeks that he wishes he had known before hand.  George Hobbs remained in the group the entire time and shared openly with his peers.   Therapeutic Modalities:   Cognitive Behavioral Therapy    Aram Beecham, LCSW 06/10/2020  2:43 PM

## 2020-06-10 NOTE — Progress Notes (Signed)
   06/10/20 2300  Psych Admission Type (Psych Patients Only)  Admission Status Voluntary  Psychosocial Assessment  Patient Complaints Anxiety;Depression  Eye Contact Fair  Facial Expression Anxious  Affect Anxious;Depressed  Speech Logical/coherent  Interaction Assertive  Motor Activity Other (Comment) (WNL)  Appearance/Hygiene Unremarkable  Behavior Characteristics Cooperative  Mood Anxious;Depressed  Thought Process  Coherency WDL  Content WDL  Delusions WDL  Perception WDL  Hallucination None reported or observed  Judgment WDL  Confusion None  Danger to Self  Current suicidal ideation? Denies  Danger to Others  Danger to Others None reported or observed

## 2020-06-10 NOTE — Plan of Care (Signed)
Patient is out of bed and active in the milieu. Pleasant on approach and denying thoughts of self harm. Denying hallucinations. Reports that he is feeling better and depression decreasing. Currently has no sign of distress. Encouragements and support provided. Safety precautions maintained.

## 2020-06-10 NOTE — Progress Notes (Signed)
Recreation Therapy Notes  Date:  11.19.21 Time: 0930 Location: [address] Dayroom  Group Topic: Stress Management  Goal Area(s) Addresses:  Patient will identify positive stress management techniques. Patient will identify benefits of using stress management post d/c.  Intervention: Stress Management  Activity: Guided Imagery.  LRT read a script that took patients for a walk along the [place] of a relaxing beach.  Patients were to listen and follow along as script was read to engage in activity.    Education: Stress Management, Discharge Planning.   Education Outcome: Acknowledges Education  Clinical Observations/Feedback: Pt did not attend activity.      Jermond Burkemper, LRT/CTRS        Adilene Areola A 06/10/2020 11:19 AM 

## 2020-06-10 NOTE — Progress Notes (Signed)
   06/10/20 0000  Psych Admission Type (Psych Patients Only)  Admission Status Voluntary  Psychosocial Assessment  Patient Complaints Anxiety  Eye Contact Fair  Facial Expression Anxious  Affect Anxious  Speech Logical/coherent  Interaction Assertive  Motor Activity Other (Comment) (wnl)  Appearance/Hygiene Unremarkable  Behavior Characteristics Cooperative  Mood Depressed  Thought Process  Coherency WDL  Content WDL  Delusions None reported or observed  Perception WDL  Hallucination None reported or observed  Judgment WDL  Confusion None  Danger to Self  Current suicidal ideation? Denies  Danger to Others  Danger to Others None reported or observed

## 2020-06-11 DIAGNOSIS — F332 Major depressive disorder, recurrent severe without psychotic features: Principal | ICD-10-CM

## 2020-06-11 LAB — GLUCOSE, CAPILLARY
Glucose-Capillary: 107 mg/dL — ABNORMAL HIGH (ref 70–99)
Glucose-Capillary: 117 mg/dL — ABNORMAL HIGH (ref 70–99)

## 2020-06-11 MED ORDER — ARIPIPRAZOLE 5 MG PO TABS: 5.0000 mg | ORAL_TABLET | Freq: Every day | ORAL | 0 refills | Status: AC

## 2020-06-11 MED ORDER — OXYCODONE HCL 5 MG PO TABS: 5.0000 mg | ORAL_TABLET | ORAL | 0 refills | Status: AC | PRN

## 2020-06-11 MED ORDER — TRAZODONE HCL 50 MG PO TABS: 50.0000 mg | ORAL_TABLET | Freq: Every day | ORAL | 0 refills | Status: AC

## 2020-06-11 MED ORDER — FLUOXETINE HCL 10 MG PO CAPS: 30.0000 mg | ORAL_CAPSULE | Freq: Every day | ORAL | 0 refills | Status: AC

## 2020-06-11 MED ORDER — DM-GUAIFENESIN ER 30-600 MG PO TB12: 1.0000 | ORAL_TABLET | Freq: Two times a day (BID) | ORAL | 0 refills | Status: AC | PRN

## 2020-06-11 NOTE — [facility] Suicide Risk Assessment (Signed)
[facility] Discharge Suicide Risk Assessment   Principal Problem: <principal problem not specified> Discharge Diagnoses: Active Problems:   MDD (major depressive disorder), recurrent episode, severe (HCC)   Total Time spent with patient: 30 minutes  Musculoskeletal: Strength & Muscle Tone: within normal limits Gait & Station: normal Patient leans: N/A  Psychiatric Specialty Exam: Review of Systems  All other systems reviewed and are negative.   Blood pressure 135/90, pulse 90, temperature 97.7 F (36.5 C), temperature source Oral, resp. rate 18, height 6' (1.829 m), weight 104.8 kg, SpO2 100 %.Body mass index is 31.33 kg/m.  General Appearance: Casual  Eye Contact::  Good  Speech:  Normal Rate409  Volume:  Normal  Mood:  Euthymic  Affect:  Congruent  Thought Process:  Coherent and Descriptions of Associations: Intact  Orientation:  Full (Time, Place, and Person)  Thought Content:  Logical  Suicidal Thoughts:  No  Homicidal Thoughts:  No  Memory:  Immediate;   Good Recent;   Good Remote;   Good  Judgement:  Intact  Insight:  Fair  Psychomotor Activity:  Normal  Concentration:  Fair  Recall:  Good  Fund of Knowledge:Good  Language: Good  Akathisia:  Negative  Handed:  Right  AIMS (if indicated):     Assets:  Communication Skills Desire for Improvement Housing Resilience Social Support Talents/Skills  Sleep:  Number of Hours: 6  Cognition: WNL  ADL's:  Intact   Mental Status Per Nursing Assessment::   On Admission:  NA  Demographic Factors:  Male and Caucasian  Loss Factors: Loss of significant relationship  Historical Factors: Impulsivity  Risk Reduction Factors:   Responsible for children under 15 years of age, Sense of responsibility to family, Living with another person, especially a relative and Positive social support  Continued Clinical Symptoms:  Depression:   Impulsivity  Cognitive Features That Contribute To Risk:  None    Suicide Risk:   Minimal: No identifiable suicidal ideation.  Patients presenting with no risk factors but with morbid ruminations; may be classified as minimal risk based on the severity of the depressive symptoms   Follow-up Information    [facility]. Schedule an appointment as soon as possible for a visit.   Why: A referral has been made on your behalf to this provider for medication management services. Contact information: [address] [place], [place] [address]  P: [phone] F: [phone]       [facility]. Go on 06/14/2020.   Why: You have an appointment on 06/14/20 at 1:00 pm with Glennis Brink for therapy services.  This appointment will be held in person.   Contact information: [address] [place], [place] [address]  Phone: [phone], [phone]              Plan Of Care/Follow-up recommendations:  Activity:  ad lib  Antonieta Pert, MD 06/11/2020, 11:54 AM

## 2020-06-11 NOTE — Progress Notes (Signed)
Pt discharged to lobby. Pt was stable and appreciative at that time. All papers were given and valuables returned. Reviewed follow up and suicide safety plan with patient. Verbal understanding expressed. Denies SI/HI and A/VH. Pt given opportunity to express concerns and ask questions.
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
## 2020-06-11 NOTE — Progress Notes (Signed)
  [facility] Adult Case Management Discharge Plan :  Will you be returning to the same living situation after discharge:  Yes,  mother At discharge, do you have transportation home?: Yes,  either mother or wife Do you have the ability to pay for your medications: Yes,  insurance  Release of information consent forms completed and emailed to Medical Records, then turned in to Medical Records by CSW.   Patient to Follow up at:  Follow-up Information    [facility]. Schedule an appointment as soon as possible for a visit.   Why: A referral has been made on your behalf to this provider for medication management services. Contact information: [address] [place], [place] [address]  P: [phone] F: [phone]       [facility]. Go on 06/14/2020.   Why: You have an appointment on 06/14/20 at 1:00 pm with Glennis Brink for therapy services.  This appointment will be held in person.   Contact information: [address] [place], [place] [address]  Phone: [phone], [phone]              Next level of care provider has access to [facility] Link:no  Safety Planning and Suicide Prevention discussed: Yes,  mother  Have you used any form of tobacco in the last 30 days? (Cigarettes, Smokeless Tobacco, Cigars, and/or Pipes): No  Has patient been referred to the Quitline?: N/A patient is not a smoker  Patient has been referred for addiction treatment: N/A  Lynnell Chad, LCSW 06/11/2020, 12:01 PM

## 2020-06-11 NOTE — Progress Notes (Addendum)
   06/11/20 1100  Psych Admission Type (Psych Patients Only)  Admission Status Voluntary  Psychosocial Assessment  Patient Complaints None  Eye Contact Fair  Facial Expression Anxious;Other (Comment)  Affect Appropriate to circumstance  Speech Logical/coherent  Interaction Assertive  Motor Activity Other (Comment) (WNL)  Appearance/Hygiene Unremarkable  Behavior Characteristics Cooperative;Appropriate to situation  Mood Pleasant  Thought Process  Coherency WDL  Content WDL  Delusions WDL  Perception WDL  Hallucination None reported or observed  Judgment WDL  Confusion None  Danger to Self  Current suicidal ideation? Denies  Danger to Others  Danger to Others None reported or observed    D. Pt observed in the dayroom interacting well with peers and staff. Pt has been calm, cooperative- friendly upon approach- states that he is "ready to leave", reporting that he wants to see his brother who is in from out of town. Per pt's self inventory, pt rated his depression, hopelessness and anxiety all 0's. . Pt currently denies SI/HI and AVH A. Labs and vitals monitored. Pt given and educated on medications. Pt supported emotionally and encouraged to express concerns and ask questions.   R. Pt remains safe with 15 minute checks. Will continue POC.

## 2020-06-11 NOTE — Progress Notes (Signed)
[facility] NOVEL CORONAVIRUS (COVID-19) DAILY CHECK-OFF SYMPTOMS - answer yes or no to each - every day NO YES  Have you had a fever in the past 24 hours?  . Fever (Temp > 37.80C / 100F) X   Have you had any of these symptoms in the past 24 hours? . New Cough .  Sore Throat  .  Shortness of Breath .  Difficulty Breathing .  Unexplained Body Aches   X   Have you had any one of these symptoms in the past 24 hours not related to allergies?   . Runny Nose .  Nasal Congestion .  Sneezing   X   If you have had runny nose, nasal congestion, sneezing in the past 24 hours, has it worsened?  X   EXPOSURES - check yes or no X   Have you traveled outside the state in the past 14 days?  X   Have you been in contact with someone with a confirmed diagnosis of COVID-19 or PUI in the past 14 days without wearing appropriate PPE?  X   Have you been living in the same home as a person with confirmed diagnosis of COVID-19 or a PUI (household contact)?    X   Have you been diagnosed with COVID-19?    X              What to do next: Answered NO to all: Answered YES to anything:   Proceed with unit schedule Follow the [facility] Inpatient Flowsheet.   

## 2020-06-11 NOTE — Discharge Instructions (Signed)
Follow up with outpatient provider appointment

## 2020-06-13 ENCOUNTER — Encounter: Payer: Self-pay | Admitting: Internal Medicine

## 2022-07-02 IMAGING — DX DG CHEST 1V PORT
1 series · 1 of 1 positions shown · non-contrast
Comparison: Yesterday

CLINICAL DATA: Ventilator.  COVID.

EXAM:
PORTABLE CHEST 1 VIEW

[chest]
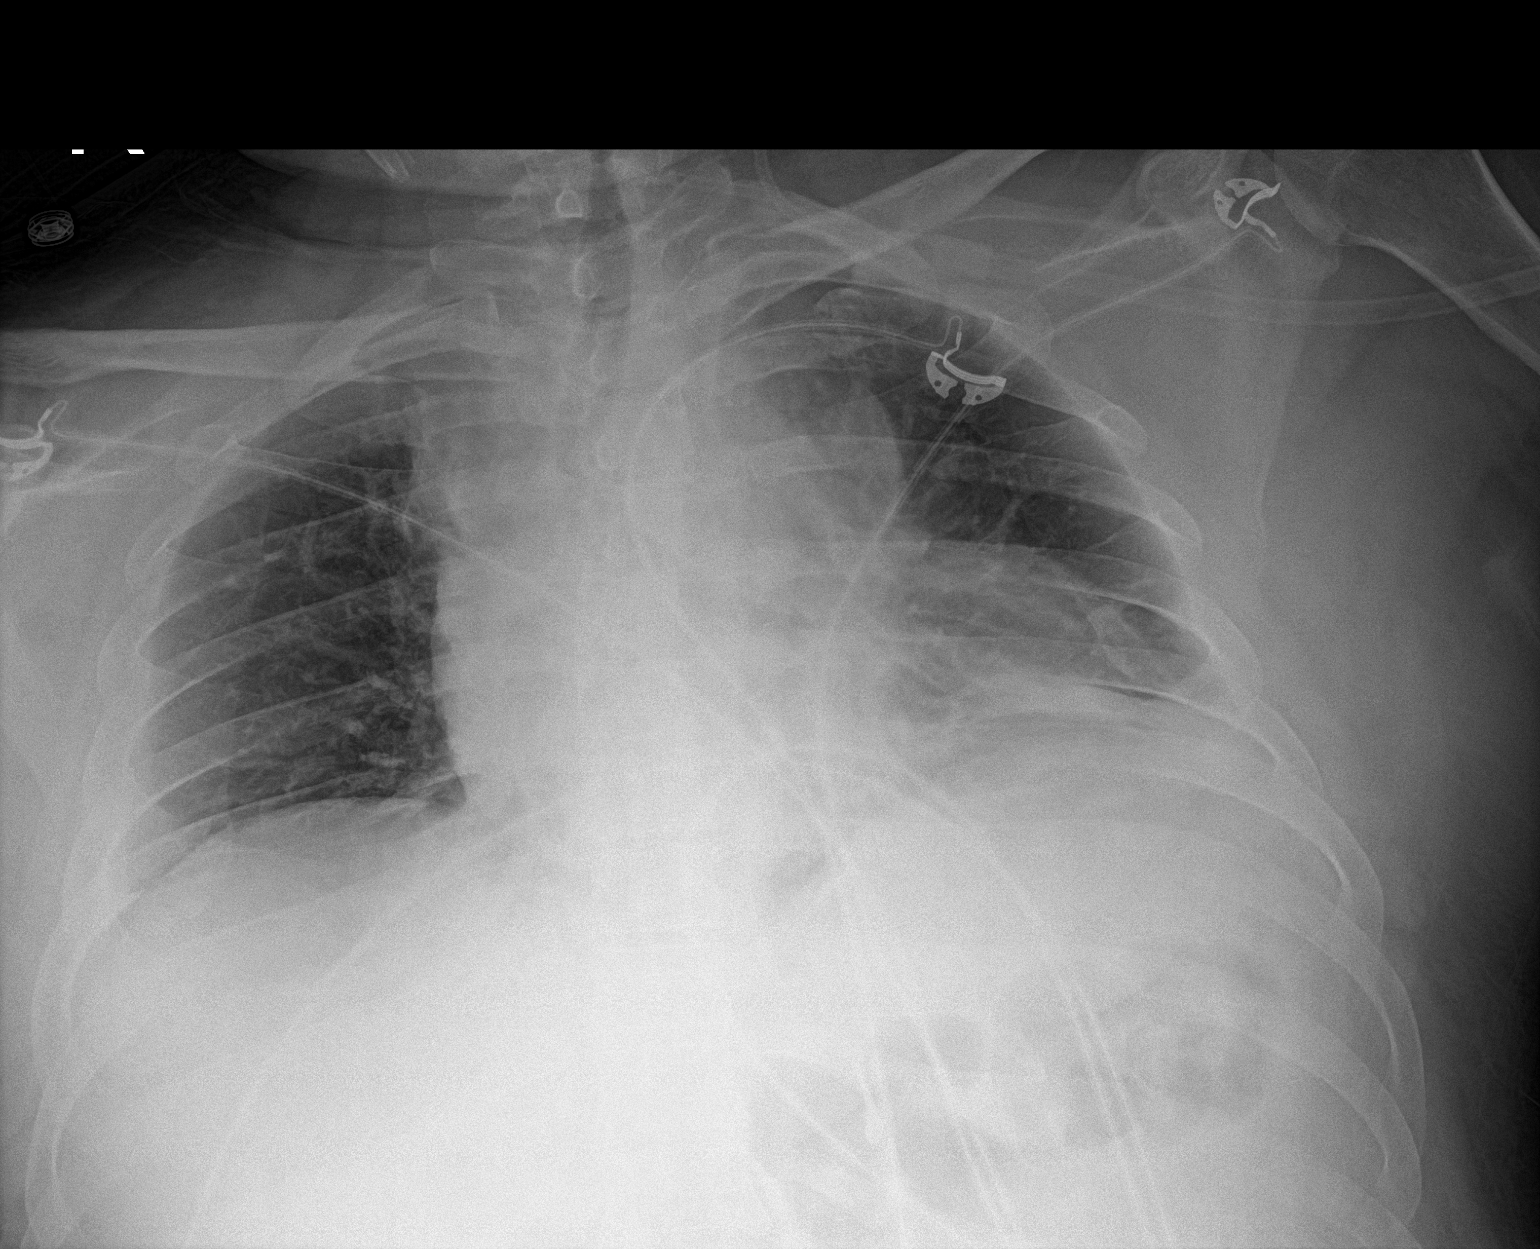

[1 of 1 positions shown; findings below may reference images not displayed]

FINDINGS: Cardiomegaly and vascular pedicle widening accentuated by very low
volume chest. Streaky density at the left more than right base. No
edema, effusion, or pneumothorax.
IMPRESSION: Worsening lung volumes with infiltrate or atelectasis at the bases.
